# Patient Record
Sex: Female | Born: 1980
Health system: Southern US, Community
[De-identification: ages and names within clinical notes are randomized; demographics above are authoritative.]

## PROBLEM LIST (undated history)

## (undated) DIAGNOSIS — F32A Depression, unspecified: Secondary | ICD-10-CM

## (undated) DIAGNOSIS — F329 Major depressive disorder, single episode, unspecified: Secondary | ICD-10-CM

## (undated) DIAGNOSIS — N83209 Unspecified ovarian cyst, unspecified side: Secondary | ICD-10-CM

## (undated) DIAGNOSIS — N915 Oligomenorrhea, unspecified: Secondary | ICD-10-CM

## (undated) DIAGNOSIS — D649 Anemia, unspecified: Secondary | ICD-10-CM

## (undated) DIAGNOSIS — E282 Polycystic ovarian syndrome: Secondary | ICD-10-CM

## (undated) DIAGNOSIS — Z9882 Breast implant status: Secondary | ICD-10-CM

## (undated) DIAGNOSIS — D62 Acute posthemorrhagic anemia: Secondary | ICD-10-CM

## (undated) DIAGNOSIS — F419 Anxiety disorder, unspecified: Secondary | ICD-10-CM

## (undated) HISTORY — PX: WISDOM TOOTH EXTRACTION: SHX21

---
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
## 2001-12-31 ENCOUNTER — Other Ambulatory Visit: Admission: RE | Admit: 2001-12-31 | Discharge: 2001-12-31 | Payer: Self-pay | Admitting: Obstetrics and Gynecology

## 2002-12-22 ENCOUNTER — Other Ambulatory Visit: Admission: RE | Admit: 2002-12-22 | Discharge: 2002-12-22 | Payer: Self-pay | Admitting: Obstetrics and Gynecology

## 2003-12-25 ENCOUNTER — Other Ambulatory Visit: Admission: RE | Admit: 2003-12-25 | Discharge: 2003-12-25 | Payer: Self-pay | Admitting: Obstetrics and Gynecology

## 2004-02-07 ENCOUNTER — Emergency Department (HOSPITAL_COMMUNITY): Admission: EM | Admit: 2004-02-07 | Discharge: 2004-02-07 | Payer: Self-pay | Admitting: Emergency Medicine

## 2004-12-31 ENCOUNTER — Ambulatory Visit (HOSPITAL_COMMUNITY): Admission: RE | Admit: 2004-12-31 | Discharge: 2004-12-31 | Payer: Self-pay | Admitting: Internal Medicine

## 2006-10-14 ENCOUNTER — Emergency Department (HOSPITAL_COMMUNITY): Admission: EM | Admit: 2006-10-14 | Discharge: 2006-10-15 | Payer: Self-pay | Admitting: Emergency Medicine

## 2008-05-27 ENCOUNTER — Ambulatory Visit (HOSPITAL_COMMUNITY): Admission: RE | Admit: 2008-05-27 | Discharge: 2008-05-27 | Payer: Self-pay | Admitting: Gynecology

## 2008-05-31 ENCOUNTER — Ambulatory Visit (HOSPITAL_COMMUNITY): Admission: RE | Admit: 2008-05-31 | Discharge: 2008-05-31 | Payer: Self-pay | Admitting: Gynecology

## 2009-01-31 ENCOUNTER — Inpatient Hospital Stay (HOSPITAL_COMMUNITY): Admission: AD | Admit: 2009-01-31 | Discharge: 2009-02-01 | Payer: Self-pay | Admitting: Obstetrics & Gynecology

## 2009-02-01 ENCOUNTER — Encounter: Payer: Self-pay | Admitting: Obstetrics and Gynecology

## 2009-09-05 ENCOUNTER — Inpatient Hospital Stay (HOSPITAL_COMMUNITY): Admission: AD | Admit: 2009-09-05 | Discharge: 2009-09-07 | Payer: Self-pay | Admitting: Obstetrics and Gynecology

## 2009-12-25 ENCOUNTER — Emergency Department (HOSPITAL_BASED_OUTPATIENT_CLINIC_OR_DEPARTMENT_OTHER): Admission: EM | Admit: 2009-12-25 | Discharge: 2009-12-25 | Payer: Self-pay | Admitting: Emergency Medicine

## 2009-12-25 ENCOUNTER — Ambulatory Visit: Payer: Self-pay | Admitting: Diagnostic Radiology

## 2010-11-03 ENCOUNTER — Emergency Department (HOSPITAL_COMMUNITY): Admission: EM | Admit: 2010-11-03 | Discharge: 2010-11-03 | Payer: Self-pay | Admitting: Family Medicine

## 2011-02-25 LAB — POCT RAPID STREP A (OFFICE): Streptococcus, Group A Screen (Direct): NEGATIVE

## 2011-03-21 LAB — CBC
Hemoglobin: 10.5 g/dL — ABNORMAL LOW (ref 12.0–15.0)
MCV: 96.7 fL (ref 78.0–100.0)
Platelets: 166 10*3/uL (ref 150–400)
Platelets: 209 10*3/uL (ref 150–400)
RDW: 12.6 % (ref 11.5–15.5)
WBC: 9 10*3/uL (ref 4.0–10.5)

## 2011-03-21 LAB — RPR: RPR Ser Ql: NONREACTIVE

## 2011-04-01 LAB — COMPREHENSIVE METABOLIC PANEL
ALT: 12 U/L (ref 0–35)
ALT: 17 U/L (ref 0–35)
AST: 21 U/L (ref 0–37)
Albumin: 2.9 g/dL — ABNORMAL LOW (ref 3.5–5.2)
Alkaline Phosphatase: 52 U/L (ref 39–117)
CO2: 24 mEq/L (ref 19–32)
CO2: 26 mEq/L (ref 19–32)
Calcium: 7.7 mg/dL — ABNORMAL LOW (ref 8.4–10.5)
Chloride: 102 mEq/L (ref 96–112)
Chloride: 97 mEq/L (ref 96–112)
Creatinine, Ser: 0.45 mg/dL (ref 0.4–1.2)
GFR calc Af Amer: >60 mL/min (ref >60)
GFR calc non Af Amer: >60 mL/min (ref >60)
GFR calc non Af Amer: >60 mL/min (ref >60)
Glucose, Bld: 104 mg/dL — ABNORMAL HIGH (ref 70–99)
Potassium: 4.2 mEq/L (ref 3.5–5.1)
Sodium: 126 mEq/L — ABNORMAL LOW (ref 135–145)
Sodium: 135 mEq/L (ref 135–145)
Total Bilirubin: 0.5 mg/dL (ref 0.3–1.2)

## 2011-04-01 LAB — URINE MICROSCOPIC-ADD ON

## 2011-04-01 LAB — URINE CULTURE

## 2011-04-01 LAB — URINALYSIS, ROUTINE W REFLEX MICROSCOPIC
Bilirubin Urine: NEGATIVE
Specific Gravity, Urine: >1.03 — ABNORMAL HIGH (ref 1.005–1.030)
pH: 6 (ref 5.0–8.0)

## 2011-04-01 LAB — CBC
Hemoglobin: 11.8 g/dL — ABNORMAL LOW (ref 12.0–15.0)
MCHC: 33.7 g/dL (ref 30.0–36.0)
MCHC: 34.4 g/dL (ref 30.0–36.0)
Platelets: 249 10*3/uL (ref 150–400)
RBC: 2.86 MIL/uL — ABNORMAL LOW (ref 3.87–5.11)
RBC: 3.56 MIL/uL — ABNORMAL LOW (ref 3.87–5.11)
WBC: 10.9 10*3/uL — ABNORMAL HIGH (ref 4.0–10.5)
WBC: 12.4 10*3/uL — ABNORMAL HIGH (ref 4.0–10.5)

## 2011-04-01 LAB — BASIC METABOLIC PANEL
BUN: 3 mg/dL — ABNORMAL LOW (ref 6–23)
Chloride: 105 mEq/L (ref 96–112)
Creatinine, Ser: 0.53 mg/dL (ref 0.4–1.2)
Glucose, Bld: 69 mg/dL — ABNORMAL LOW (ref 70–99)
Potassium: 3.3 mEq/L — ABNORMAL LOW (ref 3.5–5.1)

## 2011-04-22 IMAGING — CR DG WRIST COMPLETE 3+V*R*
4 series · 4 of 4 positions shown · non-contrast
Comparison: None available.

CLINICAL DATA: Fall.  Right wrist pain.

RIGHT WRIST - COMPLETE 3+ VIEW

[x wrist lat right]
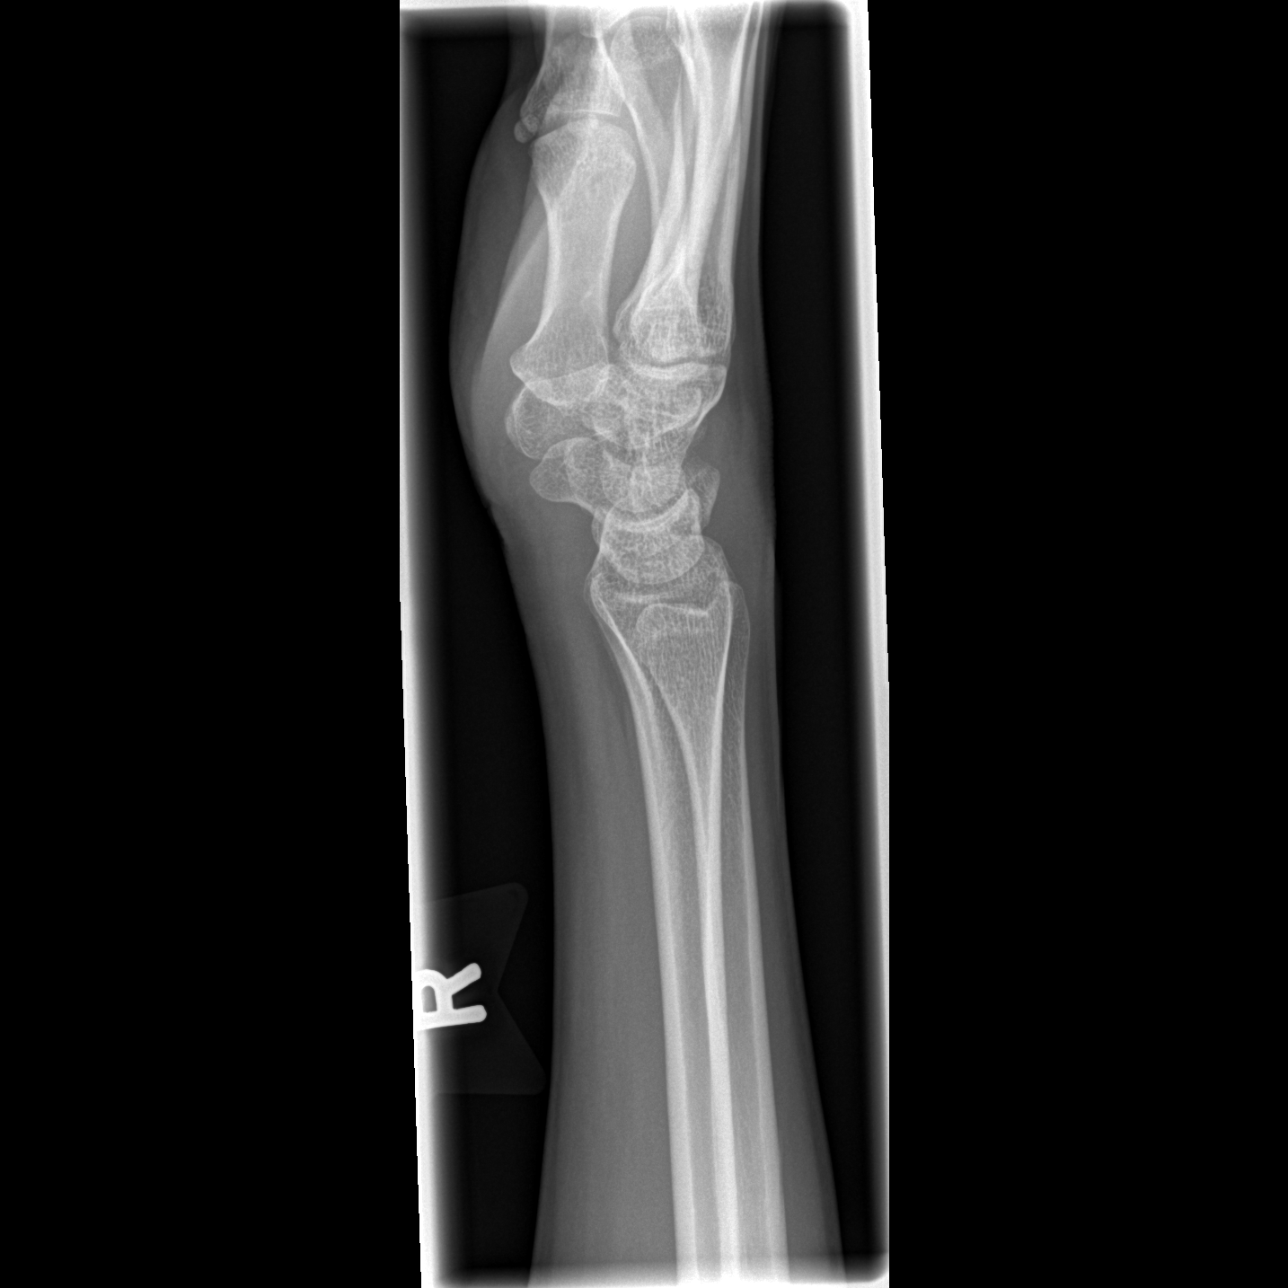

[x wrist pa right]
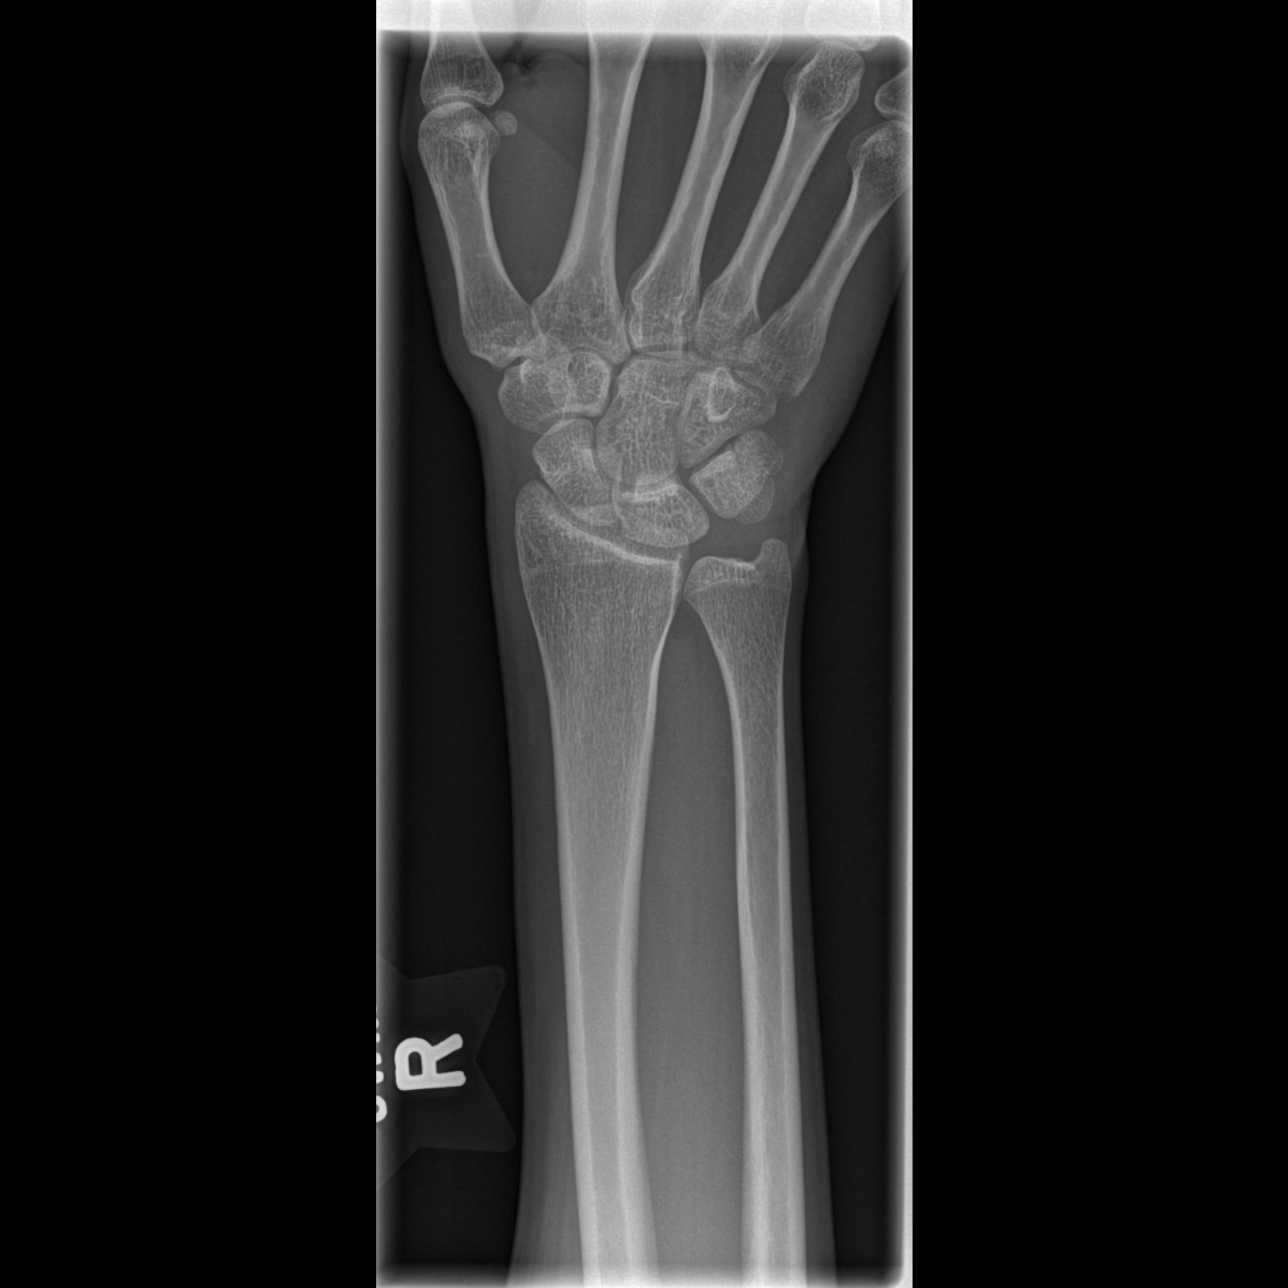

[x wrist obl right]
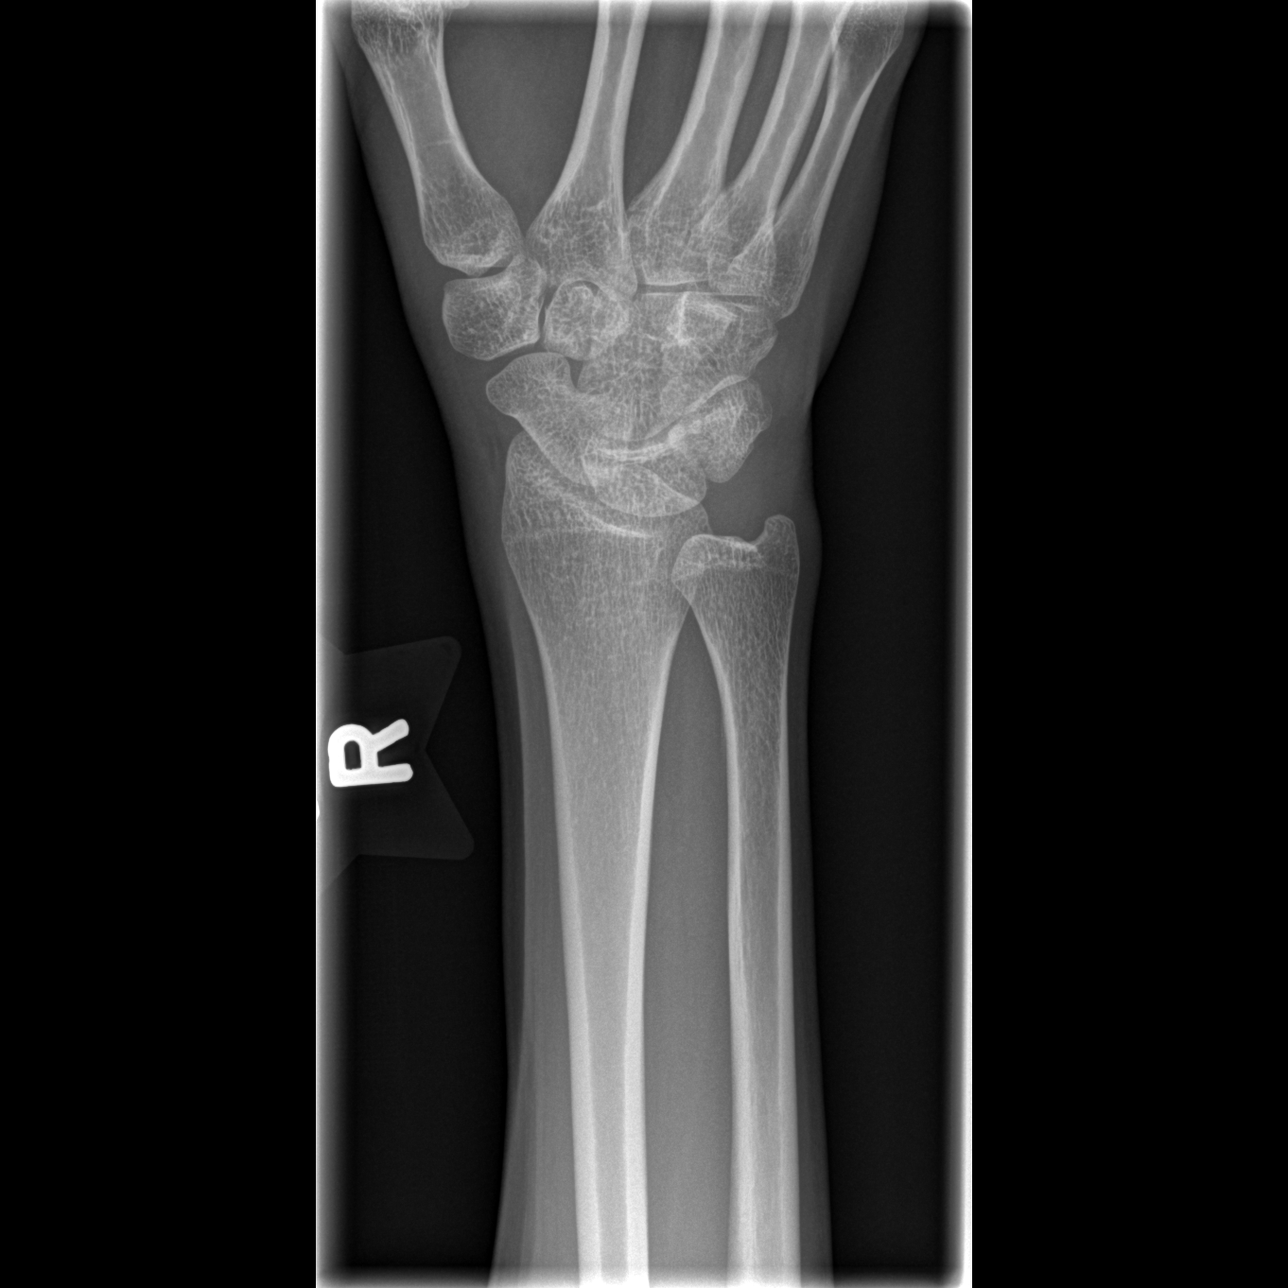

[x navicular]
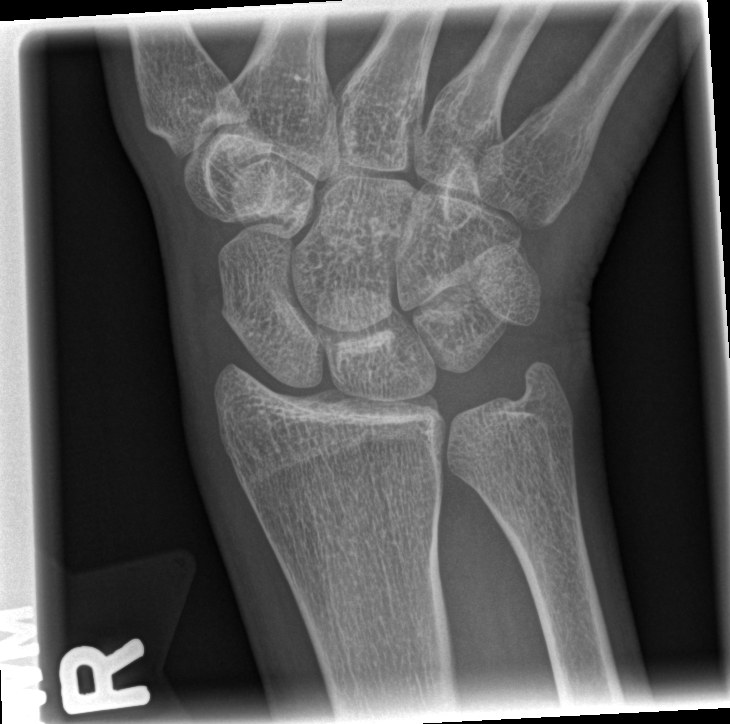

[4 of 4 positions shown; findings below may reference images not displayed]

FINDINGS: There is linear lucency extending sagittally through the
distal triquetrum, suspicious for nondisplaced fracture.
Scaphoid is intact.  Dorsal soft tissue swelling is present over
the wrist.  Fracture plane not appreciated on the lateral view.
IMPRESSION: Nondisplaced triquetral fracture.

## 2011-06-13 ENCOUNTER — Inpatient Hospital Stay (INDEPENDENT_AMBULATORY_CARE_PROVIDER_SITE_OTHER)
Admission: RE | Admit: 2011-06-13 | Discharge: 2011-06-13 | Disposition: A | Discharge status: Home / Self Care | Payer: 59 | Source: Ambulatory Visit | Attending: Emergency Medicine | Admitting: Emergency Medicine

## 2011-06-13 DIAGNOSIS — W57XXXA Bitten or stung by nonvenomous insect and other nonvenomous arthropods, initial encounter: Secondary | ICD-10-CM

## 2011-06-13 DIAGNOSIS — L0291 Cutaneous abscess, unspecified: Secondary | ICD-10-CM

## 2011-12-16 NOTE — L&D Delivery Note (Signed)
Delivery Note At 8:55 PM a viable and healthy female was delivered via Vaginal, Spontaneous Delivery (Presentation: ; Occiput Anterior).  APGAR:9 ,9 ; weight .  8lb 3 oz Placenta status: Intact, Spontaneous. Marginal cord insertion  Cord: 3 vessels with the following complications: None.  Cord pH: none  Anesthesia: Local  Episiotomy: None Lacerations: 2nd degree;Perineal Suture Repair: 3.0 chromic Est. Blood Loss (mL): 800 Uterine atony noted. Bimanual massage, cervix checked: no laceration noted. Bladder cath. IM pitocin( no IV access at the time). Methergine, cytotec per rectum done. Mom to postpartum.  Baby to nursery-stable.  Cherice Glennie A 09/19/2012, 10:08 PM

## 2012-02-20 LAB — OB RESULTS CONSOLE HEPATITIS B SURFACE ANTIGEN: Hepatitis B Surface Ag: NEGATIVE

## 2012-02-20 LAB — OB RESULTS CONSOLE ABO/RH

## 2012-02-27 LAB — OB RESULTS CONSOLE GC/CHLAMYDIA
Chlamydia: NEGATIVE
Gonorrhea: NEGATIVE

## 2012-08-31 LAB — OB RESULTS CONSOLE GBS: GBS: NEGATIVE

## 2012-09-17 ENCOUNTER — Other Ambulatory Visit: Payer: Self-pay | Admitting: Obstetrics and Gynecology

## 2012-09-19 ENCOUNTER — Encounter (HOSPITAL_COMMUNITY): Payer: Self-pay | Admitting: *Deleted

## 2012-09-19 ENCOUNTER — Inpatient Hospital Stay (HOSPITAL_COMMUNITY)
Admission: AD | Admit: 2012-09-19 | Discharge: 2012-09-21 | DRG: 774 | Disposition: A | Discharge status: Home / Self Care | Payer: 59 | Source: Ambulatory Visit | Attending: Obstetrics and Gynecology | Admitting: Obstetrics and Gynecology

## 2012-09-19 DIAGNOSIS — D62 Acute posthemorrhagic anemia: Secondary | ICD-10-CM | POA: Diagnosis not present

## 2012-09-19 DIAGNOSIS — O9903 Anemia complicating the puerperium: Secondary | ICD-10-CM | POA: Diagnosis not present

## 2012-09-19 DIAGNOSIS — O2210 Genital varices in pregnancy, unspecified trimester: Secondary | ICD-10-CM | POA: Diagnosis present

## 2012-09-19 HISTORY — DX: Major depressive disorder, single episode, unspecified: F32.9

## 2012-09-19 HISTORY — DX: Oligomenorrhea, unspecified: N91.5

## 2012-09-19 HISTORY — DX: Polycystic ovarian syndrome: E28.2

## 2012-09-19 HISTORY — DX: Anxiety disorder, unspecified: F41.9

## 2012-09-19 HISTORY — DX: Acute posthemorrhagic anemia: D62

## 2012-09-19 HISTORY — DX: Depression, unspecified: F32.A

## 2012-09-19 HISTORY — DX: Anemia, unspecified: D64.9

## 2012-09-19 HISTORY — DX: Unspecified ovarian cyst, unspecified side: N83.209

## 2012-09-19 HISTORY — DX: Breast implant status: Z98.82

## 2012-09-19 LAB — ABO/RH: ABO/RH(D): O POS

## 2012-09-19 LAB — CBC
HCT: 34.5 % — ABNORMAL LOW (ref 36.0–46.0)
MCH: 32.2 pg (ref 26.0–34.0)
MCHC: 33.9 g/dL (ref 30.0–36.0)
RDW: 12.9 % (ref 11.5–15.5)

## 2012-09-19 MED ORDER — TERBUTALINE SULFATE 1 MG/ML IJ SOLN: 0.2500 mg | Freq: Once | INTRAMUSCULAR | Status: DC | PRN

## 2012-09-19 MED ORDER — LACTATED RINGERS IV SOLN: 500.0000 mL | Freq: Once | INTRAVENOUS | Status: DC

## 2012-09-19 MED ORDER — ONDANSETRON HCL 4 MG/2ML IJ SOLN: 4.0000 mg | Freq: Four times a day (QID) | INTRAMUSCULAR | Status: DC | PRN

## 2012-09-19 MED ORDER — FENTANYL 2.5 MCG/ML BUPIVACAINE 1/10 % EPIDURAL INFUSION (WH - ANES): 14.0000 mL/h | INTRAMUSCULAR | Status: DC

## 2012-09-19 MED ORDER — IBUPROFEN 600 MG PO TABS
600.0000 mg | ORAL_TABLET | Freq: Four times a day (QID) | ORAL | Status: DC | PRN
  Administered 2012-09-19: 600 mg via ORAL
  Filled 2012-09-19: qty 1

## 2012-09-19 MED ORDER — CITRIC ACID-SODIUM CITRATE 334-500 MG/5ML PO SOLN: 30.0000 mL | ORAL | Status: DC | PRN

## 2012-09-19 MED ORDER — LIDOCAINE HCL (PF) 1 % IJ SOLN
30.0000 mL | INTRAMUSCULAR | Status: DC | PRN
  Administered 2012-09-19: 30 mL via SUBCUTANEOUS
  Filled 2012-09-19: qty 30

## 2012-09-19 MED ORDER — LACTATED RINGERS IV SOLN: INTRAVENOUS | Status: DC

## 2012-09-19 MED ORDER — OXYTOCIN 40 UNITS IN LACTATED RINGERS INFUSION - SIMPLE MED
62.5000 mL/h | Freq: Once | INTRAVENOUS | Status: AC
  Administered 2012-09-19: 62.5 mL/h via INTRAVENOUS
  Filled 2012-09-19: qty 1000

## 2012-09-19 MED ORDER — PHENYLEPHRINE 40 MCG/ML (10ML) SYRINGE FOR IV PUSH (FOR BLOOD PRESSURE SUPPORT): 80.0000 ug | PREFILLED_SYRINGE | INTRAVENOUS | Status: DC | PRN

## 2012-09-19 MED ORDER — MISOPROSTOL 200 MCG PO TABS: 800.0000 ug | ORAL_TABLET | Freq: Once | ORAL | Status: DC

## 2012-09-19 MED ORDER — DIPHENHYDRAMINE HCL 50 MG/ML IJ SOLN: 12.5000 mg | INTRAMUSCULAR | Status: DC | PRN

## 2012-09-19 MED ORDER — OXYTOCIN BOLUS FROM INFUSION
500.0000 mL | Freq: Once | INTRAVENOUS | Status: AC
  Administered 2012-09-19: 500 mL via INTRAVENOUS
  Filled 2012-09-19: qty 500

## 2012-09-19 MED ORDER — EPHEDRINE 5 MG/ML INJ: 10.0000 mg | INTRAVENOUS | Status: DC | PRN

## 2012-09-19 MED ORDER — OXYTOCIN 40 UNITS IN LACTATED RINGERS INFUSION - SIMPLE MED: 1.0000 m[IU]/min | INTRAVENOUS | Status: DC

## 2012-09-19 MED ORDER — OXYCODONE-ACETAMINOPHEN 5-325 MG PO TABS: 1.0000 | ORAL_TABLET | ORAL | Status: DC | PRN

## 2012-09-19 MED ORDER — MISOPROSTOL 200 MCG PO TABS
ORAL_TABLET | ORAL | Status: AC
  Administered 2012-09-19: 22:00:00
  Filled 2012-09-19: qty 4

## 2012-09-19 MED ORDER — ACETAMINOPHEN 325 MG PO TABS: 650.0000 mg | ORAL_TABLET | ORAL | Status: DC | PRN

## 2012-09-19 MED ORDER — LACTATED RINGERS IV SOLN: 500.0000 mL | INTRAVENOUS | Status: DC | PRN

## 2012-09-19 MED ORDER — OXYTOCIN 10 UNIT/ML IJ SOLN
INTRAMUSCULAR | Status: AC
  Administered 2012-09-19: 20 [IU]
  Filled 2012-09-19: qty 2

## 2012-09-19 MED ORDER — METHYLERGONOVINE MALEATE 0.2 MG/ML IJ SOLN
INTRAMUSCULAR | Status: AC
  Administered 2012-09-19: 0.2 mg
  Filled 2012-09-19: qty 1

## 2012-09-19 NOTE — H&P (Signed)
Nicole [name] is [name] 31 y.o. female presenting for admision due to SROM clear fluid @ 6 18 pm. GBS cx neg.  Maternal Medical History:  Reason for admission: Reason for admission: rupture of membranes.  Contractions: Onset was 3-5 hours ago.   Perceived severity is moderate.    Fetal activity: Perceived fetal activity is normal.    Prenatal complications: no prenatal complications   OB History    Grav Para Term Preterm Abortions TAB SAB Ect Mult Living   1              Past Medical History  Diagnosis Date  . Anemia   . Depression     Post partum  . Anxiety   . PCOS (polycystic ovarian syndrome)   . H/O breast augmentation   . Oligomenorrhea   . Newborn product of IVF pregnancy   . Ruptured ovarian cyst    Past Surgical History  Procedure Date  . Wisdom tooth extraction    Family History: family history is not on file. Social History:  does not have [name] smoking history on file. She does not have any smokeless tobacco history on file. She reports that she does not drink alcohol or use illicit drugs.   Prenatal Transfer Tool  Maternal Diabetes: No Genetic Screening: Normal Maternal Ultrasounds/Referrals: Normal Fetal Ultrasounds or other Referrals:  None Maternal Substance Abuse:  No Significant Maternal Medications:  None Significant Maternal Lab Results:  Lab values include: Group B Strep negative Other Comments:  None  Review of Systems  All other systems reviewed and are negative.    Dilation: 3.5 Effacement (%): 80;90 Station: -2;-1 Exam by:: L. Cresenzo, RN Blood pressure 127/80, pulse 80, temperature 98.5 F (36.9 C), temperature source Oral, resp. rate 20, height 5\' 10"  (1.778 m), weight 72.576 kg (160 lb). Maternal Exam:  Uterine Assessment: Contraction strength is moderate.  Contraction frequency is regular.   Abdomen: Patient reports no abdominal tenderness. Fetal presentation: vertex  Introitus: Vulva is positive for vulvar varicosities.   Physical  Exam  Constitutional: She is oriented to person, place, and time. She appears well-developed and well-nourished.  HENT:  Head: Atraumatic.  Eyes: EOM are normal.  Cardiovascular: Regular rhythm.   Respiratory: Effort normal.  GI: Soft. Bowel sounds are normal.  Musculoskeletal: She exhibits no edema.  Neurological: She is alert and oriented to person, place, and time.  Skin: Skin is warm and dry.  Psychiatric: She has [name] normal mood and affect.   VE( RN) 3-4 cm on admit then progressed to 6 cm and fully dilated thereafter Prenatal labs: ABO, Rh: --/--/O POS (10/06 2010) Antibody: Negative (03/08 0000) Rubella: Immune (03/08 0000) RPR: Nonreactive (07/25 0000)  HBsAg: Negative (03/08 0000)  HIV: Non-reactive (03/08 0000)  GBS: Negative (09/17 0000)   Assessment/Plan: SROM Term gestation Complete P) admit routine labs   Nicole [name] 09/19/2012, 9:58 PM

## 2012-09-20 LAB — CBC
Platelets: 163 10*3/uL (ref 150–400)
RBC: 2.84 MIL/uL — ABNORMAL LOW (ref 3.87–5.11)
RDW: 12.8 % (ref 11.5–15.5)
WBC: 16.6 10*3/uL — ABNORMAL HIGH (ref 4.0–10.5)

## 2012-09-20 MED ORDER — BENZOCAINE-MENTHOL 20-0.5 % EX AERO
1.0000 "application " | INHALATION_SPRAY | CUTANEOUS | Status: DC | PRN
  Administered 2012-09-20: 1 via TOPICAL
  Filled 2012-09-20: qty 56

## 2012-09-20 MED ORDER — ONDANSETRON HCL 4 MG/2ML IJ SOLN: 4.0000 mg | INTRAMUSCULAR | Status: DC | PRN

## 2012-09-20 MED ORDER — SIMETHICONE 80 MG PO CHEW: 80.0000 mg | CHEWABLE_TABLET | ORAL | Status: DC | PRN

## 2012-09-20 MED ORDER — PRENATAL MULTIVITAMIN CH
1.0000 | ORAL_TABLET | Freq: Every day | ORAL | Status: DC
  Administered 2012-09-20 – 2012-09-21 (×2): 1 via ORAL
  Filled 2012-09-20 (×2): qty 1

## 2012-09-20 MED ORDER — ONDANSETRON HCL 4 MG PO TABS: 4.0000 mg | ORAL_TABLET | ORAL | Status: DC | PRN

## 2012-09-20 MED ORDER — TETANUS-DIPHTH-ACELL PERTUSSIS 5-2.5-18.5 LF-MCG/0.5 IM SUSP
0.5000 mL | Freq: Once | INTRAMUSCULAR | Status: AC
  Administered 2012-09-20: 0.5 mL via INTRAMUSCULAR
  Filled 2012-09-20: qty 0.5

## 2012-09-20 MED ORDER — SENNOSIDES-DOCUSATE SODIUM 8.6-50 MG PO TABS
2.0000 | ORAL_TABLET | Freq: Every day | ORAL | Status: DC
  Administered 2012-09-20: 2 via ORAL

## 2012-09-20 MED ORDER — WITCH HAZEL-GLYCERIN EX PADS: 1.0000 "application " | MEDICATED_PAD | CUTANEOUS | Status: DC | PRN

## 2012-09-20 MED ORDER — FERROUS SULFATE 325 (65 FE) MG PO TABS
325.0000 mg | ORAL_TABLET | Freq: Two times a day (BID) | ORAL | Status: DC
  Administered 2012-09-20 – 2012-09-21 (×3): 325 mg via ORAL
  Filled 2012-09-20 (×3): qty 1

## 2012-09-20 MED ORDER — METHYLERGONOVINE MALEATE 0.2 MG/ML IJ SOLN: 0.2000 mg | Freq: Four times a day (QID) | INTRAMUSCULAR | Status: DC

## 2012-09-20 MED ORDER — METHYLERGONOVINE MALEATE 0.2 MG PO TABS
0.2000 mg | ORAL_TABLET | Freq: Four times a day (QID) | ORAL | Status: DC
  Administered 2012-09-20 (×2): 0.2 mg via ORAL
  Filled 2012-09-20 (×2): qty 1

## 2012-09-20 MED ORDER — ZOLPIDEM TARTRATE 5 MG PO TABS: 5.0000 mg | ORAL_TABLET | Freq: Every evening | ORAL | Status: DC | PRN

## 2012-09-20 MED ORDER — DOCUSATE SODIUM 100 MG PO CAPS
100.0000 mg | ORAL_CAPSULE | Freq: Every day | ORAL | Status: DC
  Administered 2012-09-20 – 2012-09-21 (×2): 100 mg via ORAL
  Filled 2012-09-20 (×2): qty 1

## 2012-09-20 MED ORDER — LANOLIN HYDROUS EX OINT: TOPICAL_OINTMENT | CUTANEOUS | Status: DC | PRN

## 2012-09-20 MED ORDER — DIPHENHYDRAMINE HCL 25 MG PO CAPS: 25.0000 mg | ORAL_CAPSULE | Freq: Four times a day (QID) | ORAL | Status: DC | PRN

## 2012-09-20 MED ORDER — OXYCODONE-ACETAMINOPHEN 5-325 MG PO TABS: 1.0000 | ORAL_TABLET | ORAL | Status: DC | PRN

## 2012-09-20 MED ORDER — IBUPROFEN 600 MG PO TABS
600.0000 mg | ORAL_TABLET | Freq: Four times a day (QID) | ORAL | Status: DC
  Administered 2012-09-20 – 2012-09-21 (×5): 600 mg via ORAL
  Filled 2012-09-20 (×5): qty 1

## 2012-09-20 MED ORDER — DIBUCAINE 1 % RE OINT: 1.0000 "application " | TOPICAL_OINTMENT | RECTAL | Status: DC | PRN

## 2012-09-20 NOTE — Progress Notes (Addendum)
PPD 1 SVD  S:  Reports feeling well- just tired              Tolerating po/ No nausea or vomiting             Bleeding is light             Pain controlled with motrin             Up ad lib / ambulatory  Newborn breast-feeding  / Circumcision planned for tomorrow   O:  A & O x 3              VS: Blood pressure 100/63, pulse 62, temperature 97.6 F (36.4 C), temperature source Oral, resp. rate 18, height 5\' 10"  (1.778 m), weight 72.576 kg (160 lb), SpO2 100.00%, unknown if currently breastfeeding.  LABS: WBC/Hgb/Hct/Plts:  16.6/9.1/26.6/163 (10/07 0528)   Lungs: Clear and unlabored respirations  Heart: regular rate   Abdomen: soft, non-tender, non-distended              Fundus: firm, non-tender, U-1  Perineum: no edema  Lochia: light  Extremities: no edema, no calf pain or tenderness    A: PPD # 1              Mild ABL anemia - stable status  Doing well - stable status  P:  Routine post partum orders             Iron and colace  Anticipate discharge home tomorrow  Marlinda Mike CNM, MSN 09/20/2012, 9:46 AM

## 2012-09-21 ENCOUNTER — Encounter (HOSPITAL_COMMUNITY): Payer: Self-pay

## 2012-09-21 DIAGNOSIS — D62 Acute posthemorrhagic anemia: Secondary | ICD-10-CM

## 2012-09-21 HISTORY — DX: Acute posthemorrhagic anemia: D62

## 2012-09-21 MED ORDER — ESCITALOPRAM OXALATE 10 MG PO TABS: 10.0000 mg | ORAL_TABLET | Freq: Every day | ORAL | Status: DC

## 2012-09-21 MED ORDER — IBUPROFEN 600 MG PO TABS: 600.0000 mg | ORAL_TABLET | Freq: Four times a day (QID) | ORAL | Status: DC

## 2012-09-21 NOTE — Progress Notes (Signed)
Patient was referred for history of depression/anxiety. * Referral screened out by Clinical Social Worker because none of the following criteria appear to apply: ~ History of anxiety/depression during this pregnancy, or of post-partum depression. ~ Diagnosis of anxiety and/or depression within last 3 years ~ History of depression due to pregnancy loss/loss of child OR * Patient's symptoms currently being treated with medication and/or therapy. Please contact the Clinical Social Worker if needs arise, or if patient requests.  Patient is currently taking Lexapro. 

## 2012-09-21 NOTE — Progress Notes (Signed)
UR chart review completed.  

## 2012-09-21 NOTE — Progress Notes (Signed)
Post Partum Day #2            Information for the patient's newborn:  Deaysia, Grigoryan [[id number]]  female   / circumcision done Feeding: breast  Subjective: No HA, SOB, CP, F/C, + nipple soreness. Pain controlled w/ Motrin. Normal vaginal bleeding, no clots.    Hx depression and PPD with last pregnancy, was treated w/ Lexapro. Discussed increased risk of recurrence, desires to resume meds prophylactically.   Objective:  Temp:  [97.8 F (36.6 C)-98 F (36.7 C)] 97.9 F (36.6 C) (10/08 0630) Pulse Rate:  [73-93] 73  (10/08 0630) Resp:  [18-20] 18  (10/08 0630) BP: (103-111)/(66-72) 103/66 mmHg (10/08 0630)  No intake or output data in the 24 hours ending 09/21/12 0932     Basename 09/20/12 0528 09/19/12 2010  WBC 16.6* 10.9*  HGB 9.1* 11.7*  HCT 26.6* 34.5*  PLT 163 189    Blood type: --/--/O POS, O POS (10/06 2010) Rubella: Immune (03/08 0000)    Physical Exam:  General: alert, cooperative and no distress Uterine Fundus: firm Lochia: appropriate Perineum: repair intact, edema mild DVT Evaluation: Negative Homan's sign. No significant calf/ankle edema.    Assessment/Plan: PPD # 2 / 31 y.o., [id number] S/P:spontaneous vaginal   Active Problems:  Postpartum care following vaginal delivery w/ 2nd degree (10/6)  Acute blood loss anemia  Mild, asymptomatic, will start oral Fe supplement to continue 4-6 wks PP Hx depression and PPD  Start Lexapro 10 mg daily TDaP given inpatient   normal postpartum exam  Continue current postpartum care  D/C home   LOS: 2 days   PAUL,DANIELA, CNM, MSN 09/21/2012, 9:32 AM

## 2012-09-21 NOTE — Discharge Summary (Signed)
Obstetric Discharge Summary Reason for Admission: onset of labor and rupture of membranes Prenatal Procedures: ultrasound Intrapartum Procedures: spontaneous vaginal delivery Postpartum Procedures: TDaP vaccine Complications-Operative and Postpartum: 2nd degree perineal laceration and hemorrhage / uterine atony Hemoglobin  Date Value Range Status  09/20/2012 9.1* 12.0 - 15.0 g/dL Final     DELTA CHECK NOTED     REPEATED TO VERIFY     HCT  Date Value Range Status  09/20/2012 26.6* 36.0 - 46.0 % Final    Physical Exam:  General: alert, cooperative and no distress Lochia: appropriate Uterine Fundus: firm Incision: healing well DVT Evaluation: Negative Homan's sign. No significant calf/ankle edema.  Discharge Diagnoses: Term Pregnancy-delivered and mild acute blood loss anemia Hx post partum depression at increased risk of recurrence  Discharge Information: Date: 09/21/2012 Activity: pelvic rest Diet: routine Medications: PNV, Ibuprofen, Colace, Iron and Lexapro Condition: stable Instructions: refer to practice specific booklet Discharge to: home Follow-up Information    Follow up with Joselynn Amoroso A, MD. Schedule an appointment as soon as possible for a visit in 6 weeks.   Contact information:   [address] [place] [address] [phone]          Newborn Data: Live born female  Birth Weight: 8 lb 3 oz (3714 g) APGAR: 9, 9  Home with mother.  PAUL,DANIELA 09/21/2012, 9:49 AM

## 2012-09-22 LAB — TYPE AND SCREEN
ABO/RH(D): O POS
Antibody Screen: NEGATIVE
Unit division: 0
Unit division: 0

## 2012-09-27 ENCOUNTER — Inpatient Hospital Stay (HOSPITAL_COMMUNITY): Admission: RE | Admit: 2012-09-27 | Payer: 59 | Source: Ambulatory Visit

## 2014-10-16 ENCOUNTER — Encounter (HOSPITAL_COMMUNITY): Payer: Self-pay

## 2014-10-19 LAB — OB RESULTS CONSOLE RUBELLA ANTIBODY, IGM: Rubella: IMMUNE

## 2014-10-19 LAB — OB RESULTS CONSOLE HEPATITIS B SURFACE ANTIGEN: HEP B S AG: NEGATIVE

## 2014-12-15 NOTE — L&D Delivery Note (Signed)
Delivery Note At 11:14 PM a viable and healthy female was delivered via Vaginal, Spontaneous Delivery (Presentation: Right Occiput Posterior).  APGAR: 8, 9; weight  pending .   Placenta status: Abnormal, Spontaneous Pathology.  Cord: 3 vessels with the following complications: None.  Cord pH: none  Anesthesia: Epidural  Episiotomy: None Lacerations: 1st degree;Perineal Suture Repair: 3.0 chromic Est. Blood Loss (mL):  400  Mom to postpartum.  Baby to Couplet care / Skin to Skin.  Nicole [name] A 05/11/2015, 11:47 PM

## 2015-05-11 ENCOUNTER — Encounter (HOSPITAL_COMMUNITY): Payer: Self-pay | Admitting: *Deleted

## 2015-05-11 ENCOUNTER — Inpatient Hospital Stay (HOSPITAL_COMMUNITY)
Admission: AD | Admit: 2015-05-11 | Discharge: 2015-05-13 | DRG: 775 | Disposition: A | Discharge status: Home / Self Care | Payer: 59 | Source: Ambulatory Visit | Attending: Obstetrics and Gynecology | Admitting: Obstetrics and Gynecology

## 2015-05-11 ENCOUNTER — Inpatient Hospital Stay (HOSPITAL_COMMUNITY): Payer: 59 | Admitting: Anesthesiology

## 2015-05-11 DIAGNOSIS — Z3A39 39 weeks gestation of pregnancy: Secondary | ICD-10-CM | POA: Diagnosis present

## 2015-05-11 DIAGNOSIS — D62 Acute posthemorrhagic anemia: Secondary | ICD-10-CM | POA: Diagnosis not present

## 2015-05-11 DIAGNOSIS — O09523 Supervision of elderly multigravida, third trimester: Secondary | ICD-10-CM

## 2015-05-11 DIAGNOSIS — Z3483 Encounter for supervision of other normal pregnancy, third trimester: Secondary | ICD-10-CM | POA: Diagnosis present

## 2015-05-11 DIAGNOSIS — IMO0001 Reserved for inherently not codable concepts without codable children: Secondary | ICD-10-CM

## 2015-05-11 LAB — CBC
HEMATOCRIT: 36.3 % (ref 36.0–46.0)
Hemoglobin: 12.4 g/dL (ref 12.0–15.0)
MCH: 32.3 pg (ref 26.0–34.0)
MCHC: 34.2 g/dL (ref 30.0–36.0)
MCV: 94.5 fL (ref 78.0–100.0)
Platelets: 177 10*3/uL (ref 150–400)
RBC: 3.84 MIL/uL — AB (ref 3.87–5.11)
RDW: 12.9 % (ref 11.5–15.5)
WBC: 9.6 10*3/uL (ref 4.0–10.5)

## 2015-05-11 LAB — OB RESULTS CONSOLE HIV ANTIBODY (ROUTINE TESTING): HIV: NONREACTIVE

## 2015-05-11 LAB — TYPE AND SCREEN
ABO/RH(D): O POS
Antibody Screen: NEGATIVE

## 2015-05-11 LAB — OB RESULTS CONSOLE GBS: GBS: NEGATIVE

## 2015-05-11 LAB — OB RESULTS CONSOLE GC/CHLAMYDIA
CHLAMYDIA, DNA PROBE: NEGATIVE
GC PROBE AMP, GENITAL: NEGATIVE

## 2015-05-11 LAB — OB RESULTS CONSOLE RPR: RPR: NONREACTIVE

## 2015-05-11 MED ORDER — OXYCODONE-ACETAMINOPHEN 5-325 MG PO TABS: 2.0000 | ORAL_TABLET | ORAL | Status: DC | PRN

## 2015-05-11 MED ORDER — OXYTOCIN 40 UNITS IN LACTATED RINGERS INFUSION - SIMPLE MED
62.5000 mL/h | INTRAVENOUS | Status: DC
Start: 2015-05-11 — End: 2015-05-12

## 2015-05-11 MED ORDER — OXYTOCIN 40 UNITS IN LACTATED RINGERS INFUSION - SIMPLE MED
INTRAVENOUS | Status: AC
  Administered 2015-05-11: 2 m[IU]/min via INTRAVENOUS
  Filled 2015-05-11: qty 1000

## 2015-05-11 MED ORDER — LACTATED RINGERS IV SOLN: 500.0000 mL | INTRAVENOUS | Status: DC | PRN

## 2015-05-11 MED ORDER — SODIUM CHLORIDE 0.9 % IV SOLN
2.0000 g | Freq: Once | INTRAVENOUS | Status: DC
  Filled 2015-05-11: qty 2000

## 2015-05-11 MED ORDER — LIDOCAINE-EPINEPHRINE (PF) 2 %-1:200000 IJ SOLN
INTRAMUSCULAR | Status: DC | PRN
  Administered 2015-05-11: 4 mL

## 2015-05-11 MED ORDER — PHENYLEPHRINE 40 MCG/ML (10ML) SYRINGE FOR IV PUSH (FOR BLOOD PRESSURE SUPPORT)
PREFILLED_SYRINGE | INTRAVENOUS | Status: AC
  Filled 2015-05-11: qty 20

## 2015-05-11 MED ORDER — CITRIC ACID-SODIUM CITRATE 334-500 MG/5ML PO SOLN: 30.0000 mL | ORAL | Status: DC | PRN

## 2015-05-11 MED ORDER — LIDOCAINE HCL (PF) 1 % IJ SOLN
30.0000 mL | INTRAMUSCULAR | Status: DC | PRN
  Filled 2015-05-11: qty 30

## 2015-05-11 MED ORDER — ONDANSETRON HCL 4 MG/2ML IJ SOLN: 4.0000 mg | Freq: Four times a day (QID) | INTRAMUSCULAR | Status: DC | PRN

## 2015-05-11 MED ORDER — CLINDAMYCIN PHOSPHATE 900 MG/50ML IV SOLN
900.0000 mg | Freq: Three times a day (TID) | INTRAVENOUS | Status: DC
  Filled 2015-05-11: qty 50

## 2015-05-11 MED ORDER — OXYTOCIN 40 UNITS IN LACTATED RINGERS INFUSION - SIMPLE MED
1.0000 m[IU]/min | INTRAVENOUS | Status: DC
  Administered 2015-05-11: 2 m[IU]/min via INTRAVENOUS

## 2015-05-11 MED ORDER — LIDOCAINE HCL (PF) 1 % IJ SOLN
INTRAMUSCULAR | Status: AC
  Filled 2015-05-11: qty 30

## 2015-05-11 MED ORDER — EPHEDRINE 5 MG/ML INJ
10.0000 mg | INTRAVENOUS | Status: DC | PRN
  Filled 2015-05-11: qty 2

## 2015-05-11 MED ORDER — TERBUTALINE SULFATE 1 MG/ML IJ SOLN: 0.2500 mg | Freq: Once | INTRAMUSCULAR | Status: AC | PRN

## 2015-05-11 MED ORDER — OXYCODONE-ACETAMINOPHEN 5-325 MG PO TABS: 1.0000 | ORAL_TABLET | ORAL | Status: DC | PRN

## 2015-05-11 MED ORDER — PHENYLEPHRINE 40 MCG/ML (10ML) SYRINGE FOR IV PUSH (FOR BLOOD PRESSURE SUPPORT)
80.0000 ug | PREFILLED_SYRINGE | INTRAVENOUS | Status: DC | PRN
  Filled 2015-05-11: qty 2

## 2015-05-11 MED ORDER — OXYTOCIN BOLUS FROM INFUSION: 500.0000 mL | INTRAVENOUS | Status: DC

## 2015-05-11 MED ORDER — DIPHENHYDRAMINE HCL 50 MG/ML IJ SOLN: 12.5000 mg | INTRAMUSCULAR | Status: DC | PRN

## 2015-05-11 MED ORDER — LACTATED RINGERS IV SOLN
INTRAVENOUS | Status: DC
  Administered 2015-05-11 (×2): via INTRAVENOUS

## 2015-05-11 MED ORDER — FENTANYL 2.5 MCG/ML BUPIVACAINE 1/10 % EPIDURAL INFUSION (WH - ANES)
INTRAMUSCULAR | Status: AC
  Administered 2015-05-11: 14 mL/h via EPIDURAL
  Filled 2015-05-11: qty 125

## 2015-05-11 MED ORDER — ACETAMINOPHEN 325 MG PO TABS: 650.0000 mg | ORAL_TABLET | ORAL | Status: DC | PRN

## 2015-05-11 MED ORDER — BUPIVACAINE HCL (PF) 0.25 % IJ SOLN
INTRAMUSCULAR | Status: DC | PRN
  Administered 2015-05-11 (×2): 4 mL

## 2015-05-11 MED ORDER — OXYTOCIN 10 UNIT/ML IJ SOLN: 10.0000 [IU] | Freq: Once | INTRAMUSCULAR | Status: DC

## 2015-05-11 MED ORDER — FENTANYL 2.5 MCG/ML BUPIVACAINE 1/10 % EPIDURAL INFUSION (WH - ANES)
14.0000 mL/h | INTRAMUSCULAR | Status: DC | PRN
  Administered 2015-05-11: 12 mL/h via EPIDURAL
  Administered 2015-05-11: 14 mL/h via EPIDURAL

## 2015-05-11 NOTE — Anesthesia Procedure Notes (Signed)
Epidural Patient location during procedure: OB  Staffing Anesthesiologist: Shirly Bartosiewicz, CHRIS Performed by: anesthesiologist   Preanesthetic Checklist Completed: patient identified, surgical consent, pre-op evaluation, timeout performed, IV checked, risks and benefits discussed and monitors and equipment checked  Epidural Patient position: sitting Prep: site prepped and draped and DuraPrep Patient monitoring: heart rate, cardiac monitor, continuous pulse ox and blood pressure Approach: midline Location: L4-L5 Injection technique: LOR saline  Needle:  Needle type: Tuohy  Needle gauge: 17 G Needle length: 9 cm Needle insertion depth: 5 cm Catheter type: closed end flexible Catheter size: 19 Gauge Catheter at skin depth: 12 cm Test dose: negative and 2% lidocaine with Epi 1:200 K  Assessment Events: blood not aspirated, injection not painful, no injection resistance, negative IV test and no paresthesia  Additional Notes H+P and labs checked, risks and benefits discussed with the patient, consent obtained, procedure tolerated well and without complications.  Reason for block:procedure for pain

## 2015-05-11 NOTE — Anesthesia Preprocedure Evaluation (Signed)
Anesthesia Evaluation  Patient identified by MRN, date of birth, ID band Patient awake    Reviewed: Allergy & Precautions, NPO status , Patient's Chart, lab work & pertinent test results  History of Anesthesia Complications Negative for: history of anesthetic complications  Airway Mallampati: II  TM Distance: >3 FB Neck ROM: Full    Dental  (+) Teeth Intact   Pulmonary neg pulmonary ROS,  breath sounds clear to auscultation        Cardiovascular negative cardio ROS  Rhythm:Regular     Neuro/Psych PSYCHIATRIC DISORDERS Anxiety Depression negative neurological ROS     GI/Hepatic negative GI ROS, Neg liver ROS,   Endo/Other  negative endocrine ROS  Renal/GU negative Renal ROS     Musculoskeletal   Abdominal   Peds  Hematology negative hematology ROS (+)   Anesthesia Other Findings   Reproductive/Obstetrics (+) Pregnancy                             Anesthesia Physical Anesthesia Plan  ASA: II  Anesthesia Plan: Epidural   Post-op Pain Management:    Induction:   Airway Management Planned:   Additional Equipment:   Intra-op Plan:   Post-operative Plan:   Informed Consent: I have reviewed the patients History and Physical, chart, labs and discussed the procedure including the risks, benefits and alternatives for the proposed anesthesia with the patient or authorized representative who has indicated his/her understanding and acceptance.     Plan Discussed with: Anesthesiologist  Anesthesia Plan Comments:         Anesthesia Quick Evaluation

## 2015-05-11 NOTE — Progress Notes (Signed)
S: comfortable  O: Epidural Pitocin SROM clear fluid VE: 9.5/100/0 station  Tracing: baseline 140 (+) early decel Ctx ? Frequency'  IMP: active phase P) cont present mgmt

## 2015-05-11 NOTE — H&P (Signed)
Nicole Nicole [name] is Nicole [name] 34 y.o. female presenting @ term in active labor. Intact membrane. Maternal Medical History:  Reason for admission: Contractions.   Fetal activity: Perceived fetal activity is normal.    Prenatal complications: no prenatal complications   OB History    Gravida Para Term Preterm AB TAB SAB Ectopic Multiple Living   4 2 1  1  1   2      Past Medical History  Diagnosis Date  . Anemia   . Depression     Post partum  . Anxiety   . PCOS (polycystic ovarian syndrome)   . H/O breast augmentation   . Oligomenorrhea   . Newborn product of IVF pregnancy   . Ruptured ovarian cyst   . Acute blood loss anemia 09/21/2012   Past Surgical History  Procedure Laterality Date  . Wisdom tooth extraction     Family History: family history is not on file. Social History:  reports that she has never smoked. She has never used smokeless tobacco. She reports that she does not drink alcohol or use illicit drugs.   Prenatal Transfer Tool  Maternal Diabetes: No Genetic Screening: Normal Maternal Ultrasounds/Referrals: Normal Fetal Ultrasounds or other Referrals:  None Maternal Substance Abuse:  No Significant Maternal Medications:  None Significant Maternal Lab Results:  Lab values include: Group B Strep negative Other Comments:  None  Review of Systems  All other systems reviewed and are negative.     unknown if currently breastfeeding. Maternal Exam:  Uterine Assessment: Contraction strength is moderate.  Contraction frequency is regular.   Abdomen: Estimated fetal weight is 7lb 2 oz.   Fetal presentation: vertex  Introitus: Vulva is positive for vulvar varicosities.   Physical Exam  Constitutional: She is oriented to person, place, and time. She appears well-developed and well-nourished.  HENT:  Head: Atraumatic.  Eyes: EOM are normal.  Neck: Neck supple.  Cardiovascular: Regular rhythm.   Respiratory: Breath sounds normal.  GI: Soft.  Neurological: She is  alert and oriented to person, place, and time.  Skin: Skin is warm and dry.  Psychiatric: She has Nicole [name] normal mood and affect.    Prenatal labs: ABO, Rh:  O positive Antibody:  neg Rubella:  Immune RPR:   NR HBsAg:   neg HIV:   NR GBS:   neg  Assessment/Plan: Labor  Term  P) admit routine lab. Epidural prn. Amniotomy prn   Nicole Nicole [name] 05/11/2015, 6:27 PM

## 2015-05-12 ENCOUNTER — Encounter (HOSPITAL_COMMUNITY): Payer: Self-pay | Admitting: *Deleted

## 2015-05-12 DIAGNOSIS — D62 Acute posthemorrhagic anemia: Secondary | ICD-10-CM | POA: Diagnosis not present

## 2015-05-12 LAB — CBC
HEMATOCRIT: 24.8 % — AB (ref 36.0–46.0)
Hemoglobin: 8.5 g/dL — ABNORMAL LOW (ref 12.0–15.0)
MCH: 32.6 pg (ref 26.0–34.0)
MCHC: 34.7 g/dL (ref 30.0–36.0)
MCV: 93.9 fL (ref 78.0–100.0)
Platelets: 148 10*3/uL — ABNORMAL LOW (ref 150–400)
RBC: 2.64 MIL/uL — ABNORMAL LOW (ref 3.87–5.11)
RDW: 13 % (ref 11.5–15.5)
WBC: 11.7 10*3/uL — AB (ref 4.0–10.5)

## 2015-05-12 LAB — RPR: RPR Ser Ql: NONREACTIVE

## 2015-05-12 MED ORDER — IBUPROFEN 600 MG PO TABS
600.0000 mg | ORAL_TABLET | Freq: Four times a day (QID) | ORAL | Status: DC
  Administered 2015-05-12 – 2015-05-13 (×5): 600 mg via ORAL
  Filled 2015-05-12 (×5): qty 1

## 2015-05-12 MED ORDER — SENNOSIDES-DOCUSATE SODIUM 8.6-50 MG PO TABS
2.0000 | ORAL_TABLET | ORAL | Status: DC
  Administered 2015-05-12: 2 via ORAL
  Filled 2015-05-12: qty 2

## 2015-05-12 MED ORDER — DIPHENHYDRAMINE HCL 25 MG PO CAPS
25.0000 mg | ORAL_CAPSULE | Freq: Four times a day (QID) | ORAL | Status: DC | PRN
Start: 2015-05-12 — End: 2015-05-13

## 2015-05-12 MED ORDER — ZOLPIDEM TARTRATE 5 MG PO TABS: 5.0000 mg | ORAL_TABLET | Freq: Every evening | ORAL | Status: DC | PRN

## 2015-05-12 MED ORDER — SIMETHICONE 80 MG PO CHEW: 80.0000 mg | CHEWABLE_TABLET | ORAL | Status: DC | PRN

## 2015-05-12 MED ORDER — ONDANSETRON HCL 4 MG/2ML IJ SOLN: 4.0000 mg | INTRAMUSCULAR | Status: DC | PRN

## 2015-05-12 MED ORDER — MAGNESIUM OXIDE 400 (241.3 MG) MG PO TABS
400.0000 mg | ORAL_TABLET | Freq: Every day | ORAL | Status: DC
  Administered 2015-05-12 – 2015-05-13 (×2): 400 mg via ORAL
  Filled 2015-05-12 (×2): qty 1

## 2015-05-12 MED ORDER — FERROUS SULFATE 325 (65 FE) MG PO TABS
325.0000 mg | ORAL_TABLET | Freq: Two times a day (BID) | ORAL | Status: DC
Start: 2015-05-12 — End: 2015-05-13
  Administered 2015-05-12 – 2015-05-13 (×2): 325 mg via ORAL
  Filled 2015-05-12 (×2): qty 1

## 2015-05-12 MED ORDER — LANOLIN HYDROUS EX OINT: TOPICAL_OINTMENT | CUTANEOUS | Status: DC | PRN

## 2015-05-12 MED ORDER — ACETAMINOPHEN 325 MG PO TABS: 650.0000 mg | ORAL_TABLET | ORAL | Status: DC | PRN

## 2015-05-12 MED ORDER — DIBUCAINE 1 % RE OINT
1.0000 | TOPICAL_OINTMENT | RECTAL | Status: DC | PRN
Start: 2015-05-12 — End: 2015-05-13

## 2015-05-12 MED ORDER — OXYCODONE-ACETAMINOPHEN 5-325 MG PO TABS
1.0000 | ORAL_TABLET | ORAL | Status: DC | PRN
  Administered 2015-05-12: 1 via ORAL
  Filled 2015-05-12: qty 1

## 2015-05-12 MED ORDER — WITCH HAZEL-GLYCERIN EX PADS: 1.0000 "application " | MEDICATED_PAD | CUTANEOUS | Status: DC | PRN

## 2015-05-12 MED ORDER — ONDANSETRON HCL 4 MG PO TABS: 4.0000 mg | ORAL_TABLET | ORAL | Status: DC | PRN

## 2015-05-12 MED ORDER — OXYCODONE-ACETAMINOPHEN 5-325 MG PO TABS
2.0000 | ORAL_TABLET | ORAL | Status: DC | PRN
  Administered 2015-05-12: 2 via ORAL
  Filled 2015-05-12: qty 2

## 2015-05-12 MED ORDER — BENZOCAINE-MENTHOL 20-0.5 % EX AERO: 1.0000 "application " | INHALATION_SPRAY | CUTANEOUS | Status: DC | PRN

## 2015-05-12 MED ORDER — PRENATAL MULTIVITAMIN CH
1.0000 | ORAL_TABLET | Freq: Every day | ORAL | Status: DC
  Administered 2015-05-12: 1 via ORAL
  Filled 2015-05-12: qty 1

## 2015-05-12 NOTE — Progress Notes (Signed)
Acknowledged order for social work consult regarding mother's hx anxiety and depression * Referral screened out by Clinical Social Worker because none of the following criteria appear to apply:  ~ History of anxiety/depression during this pregnancy, or of post-partum depression.  ~ Diagnosis of anxiety and/or depression within last 3 years  ~ History of depression due to pregnancy loss/loss of child  OR  * Patient's symptoms currently being treated.  RN reported that the MOB is bonding/interacting well with the infant. RN reported that MOB did not appear anxious/overwhelmed at this time.   Please contact the Clinical Social Worker if needs arise, or by the patient's request. 

## 2015-05-12 NOTE — Progress Notes (Signed)
PPD 1 SVD  S:  Reports feeling well - no sleep yet but still wired             Tolerating po/ No nausea or vomiting             Bleeding is light             Pain controlled with motrin             Up ad lib / ambulatory / voiding QS  Newborn breast feeding   O:               VS: BP 94/63 mmHg  Pulse 77  Temp(Src) 98.9 F (37.2 C) (Oral)  Resp 18  Ht 5\' 11"  (1.803 m)  Wt 71.215 kg (157 lb)  BMI 21.91 kg/m2  SpO2 100%  Breastfeeding? Unknown   LABS:              Recent Labs  05/11/15 1835 05/12/15 0558  WBC 9.6 11.7*  HGB 12.4 8.5*  PLT 177 148*               Blood type: --/--/O POS (05/27 1835)  Rubella: Immune (11/05 0000)                     I&O: Intake/Output      05/27 0701 - 05/28 0700 05/28 0701 - 05/29 0700   Urine (mL/kg/hr) 1000    Blood 726    Total Output 1726     Net -1726                        Physical Exam:             Alert and oriented X3  Abdomen: soft, non-tender, non-distended              Fundus: firm, non-tender, U-1  Perineum: no edema  Lochia: light  Extremities: no edema, no calf pain or tenderness, small peripheral varicosities and spider veins     A: PPD # 1              ABL anemia  Doing well - stable status  P: Routine post partum orders  Iron and magnesium x 4-6 weeks             anticipate DC tomorrow  Marlinda MikeBAILEY, Anisia Leija CNM, MSN, FACNM 05/12/2015, 10:05 AM

## 2015-05-12 NOTE — Lactation Note (Signed)
This note was copied from the chart of Nicole Randee Huston. Lactation Consultation Note: Lactation Brochure given to mother. Infant has been to the breast 8 times with 4 stools and no void at this time. Mother has a history of having sore nipples when breastfeeding other children. She states she breastfed and pumped for 2 months. Mother also has a history of PCOS and breast augmentation. She states she would like to start pumping now due to soreness. Mother denies having any cracking only tenderness. Mother was given a pump kit and a DEBP at the bedside. Mother father and infant are trying to nap at present. Mother will page when ready to start pumping. Mother was also given comfort gels. Advised mother to page when for [facility] or staff nurse to assist with next feeding.   Patient Name: Nicole [name] [id number] Date: 05/12/2015 Reason for consult: Initial assessment   Maternal Data Has patient been taught Hand Expression?: Yes Does the patient have breastfeeding experience prior to this delivery?: Yes  Feeding Length of feed: 15 min  LATCH Score/Interventions                      Lactation Tools Discussed/Used Pump Review: Setup, frequency, and cleaning;Milk Storage Initiated by:: Staff nurse Date initiated:: 05/12/15   Consult Status Consult Status: Follow-up Date: 05/12/15 Follow-up type: In-patient    Jess [facility] 05/12/2015, 11:04 AM

## 2015-05-12 NOTE — Anesthesia Postprocedure Evaluation (Signed)
Anesthesia Post Note  Patient: Nicole [name]  Procedure(s) Performed: * No procedures listed *  Anesthesia type: Epidural  Patient location: Mother/Baby  Post pain: Pain level controlled  Post assessment: Post-op Vital signs reviewed  Last Vitals:  Filed Vitals:   05/12/15 0800  BP: 94/63  Pulse: 77  Temp: 37.2 C  Resp: 18    Post vital signs: Reviewed  Level of consciousness:alert  Complications: No apparent anesthesia complications

## 2015-05-13 MED ORDER — NAPROXEN SODIUM 220 MG PO TABS: 220.0000 mg | ORAL_TABLET | Freq: Two times a day (BID) | ORAL | Status: DC

## 2015-05-13 MED ORDER — MAGNESIUM OXIDE 400 (241.3 MG) MG PO TABS: 400.0000 mg | ORAL_TABLET | Freq: Every day | ORAL | Status: DC

## 2015-05-13 MED ORDER — OXYCODONE-ACETAMINOPHEN 5-325 MG PO TABS: 1.0000 | ORAL_TABLET | ORAL | Status: DC | PRN

## 2015-05-13 MED ORDER — FERROUS SULFATE 325 (65 FE) MG PO TABS: 325.0000 mg | ORAL_TABLET | Freq: Two times a day (BID) | ORAL | Status: DC

## 2015-05-13 MED ORDER — CYCLOBENZAPRINE HCL 10 MG PO TABS: 10.0000 mg | ORAL_TABLET | Freq: Two times a day (BID) | ORAL | Status: DC

## 2015-05-13 NOTE — Discharge Summary (Signed)
Obstetric Discharge Summary  Reason for Admission: onset of labor Prenatal Procedures: none Intrapartum Procedures: spontaneous vaginal delivery Postpartum Procedures: none Complications-Operative and Postpartum: 1st degree perineal laceration HEMOGLOBIN  Date Value Ref Range Status  05/12/2015 8.5* 12.0 - 15.0 g/dL Final    Comment:    REPEATED TO VERIFY DELTA CHECK NOTED    HCT  Date Value Ref Range Status  05/12/2015 24.8* 36.0 - 46.0 % Final    Physical Exam:  General: alert, cooperative and no distress Lochia: appropriate Uterine Fundus: firm Perineum - no edema / 1st degree repair Left shoulder pain with (+) point tenderness c/w pinched nerve - positional DVT Evaluation: No evidence of DVT seen on physical exam.  Discharge Diagnoses: Term Pregnancy-delivered / acute blood loss anemia / shoulder (L) pain  Discharge Information: Date: 05/13/2015 Activity: pelvic rest Diet: routine Medications: PNV, FeSo4, magnesium, Aleve, flexeril, percocet Condition: stable Instructions: refer to practice specific booklet Discharge to: home Follow-up Information    Follow up with COUSINS,SHERONETTE A, MD. Schedule an appointment as soon as possible for a visit in 6 weeks.   Specialty:  Obstetrics and Gynecology   Contact information:   [address] [place] [id number] [phone]       Newborn Data: Live born female  Birth Weight: 7 lb 13.6 oz (3560 g) APGAR: 8, 9  Home with mother.  Marlinda MikeBAILEY, TANYA 05/13/2015, 10:56 AM

## 2015-05-13 NOTE — Progress Notes (Signed)
PPD 2 SVD  S:  Reports poorly this am due to persistent shoulder pain (left) unresolved with pain medication / heat / slightly better with removal of bra across shoulder             Tolerating po/ No nausea or vomiting             Bleeding is light             Pain controlled with motrin prior to shoulder pain but used percocet since             Up ad lib / ambulatory / voiding QS  Newborn bottle-feeding with formula now   O:               VS: BP 86/51 mmHg  Pulse 60  Temp(Src) 98.5 F (36.9 C) (Oral)  Resp 18  Ht 5\' 11"  (1.803 m)  Wt 71.215 kg (157 lb)  BMI 21.91 kg/m2  SpO2 100%  Breastfeeding? Unknown   LABS:              Recent Labs  05/11/15 1835 05/12/15 0558  WBC 9.6 11.7*  HGB 12.4 8.5*  PLT 177 148*               Blood type: --/--/O POS (05/27 1835)  Rubella: Immune (11/05 0000)                          Physical Exam:             Alert and oriented X3  Left shoulder - mobile / tenderness with palpation or contact (+) point tenderness  Abdomen: soft, non-tender, non-distended              Fundus: firm, non-tender, U-3  Perineum: no edema  Lochia: light  Extremities: no edema, no calf pain or tenderness    A: PPD # 2 SVD              ABL anemia             Shoulder pain most likely pinched nerve pain - positional & aggravated with weight & tension from bra straps  Doing well - stable status  P: Routine post partum orders  Iron and magnesium x 1 month             Flexeril and Aleve for shoulder pain / topical heat for comfort - call if no improvement in 48 hours or sooner if any worsening of pain              Engorgement / breast care for next 2-4 days - ok Ace bandage (4-5 inch) wrap for  Breast support and engorgement - to avoid bra straps across shoulder next 2-3 days  Marlinda MikeBAILEY, Iveth Heidemann CNM, MSN, [facility]ACNM 05/13/2015, 9:34 AM

## 2015-12-18 MED FILL — NORETHIND-ETH ESTRAD 1-0.02: 1-20 | 84 days supply | Qty: 63 | Fill #1

## 2016-01-31 DIAGNOSIS — Z13220 Encounter for screening for lipoid disorders: Secondary | ICD-10-CM | POA: Diagnosis not present

## 2016-01-31 DIAGNOSIS — Z0000 Encounter for general adult medical examination without abnormal findings: Secondary | ICD-10-CM | POA: Diagnosis not present

## 2016-01-31 DIAGNOSIS — R5383 Other fatigue: Secondary | ICD-10-CM | POA: Diagnosis not present

## 2016-02-08 DIAGNOSIS — Z0000 Encounter for general adult medical examination without abnormal findings: Secondary | ICD-10-CM | POA: Diagnosis not present

## 2016-02-08 DIAGNOSIS — R319 Hematuria, unspecified: Secondary | ICD-10-CM | POA: Diagnosis not present

## 2016-02-08 MED FILL — ESCITALOPRAM 20 MG TABLET: 20 | 30 days supply | Qty: 30 | Fill #5

## 2016-04-22 MED FILL — KETOCONAZOLE 2% SHAMPOO: 2 | 30 days supply | Qty: 120 | Fill #1

## 2016-04-22 MED FILL — ESCITALOPRAM 20 MG TABLET: 20 | 30 days supply | Qty: 30 | Fill #6

## 2016-04-22 MED FILL — CLOBETASOL 0.05% SHAMPOO: 0.05 | 29 days supply | Qty: 118 | Fill #1

## 2016-06-11 MED FILL — LARIN 21 1-20 TABLET: 1-20 | 84 days supply | Qty: 63 | Fill #2

## 2016-07-04 DIAGNOSIS — J0141 Acute recurrent pansinusitis: Secondary | ICD-10-CM | POA: Diagnosis not present

## 2016-08-22 MED FILL — ESCITALOPRAM 20 MG TABLET: 20 | 30 days supply | Qty: 30 | Fill #0

## 2016-10-08 DIAGNOSIS — F520 Hypoactive sexual desire disorder: Secondary | ICD-10-CM | POA: Diagnosis not present

## 2016-10-08 DIAGNOSIS — Z1321 Encounter for screening for nutritional disorder: Secondary | ICD-10-CM | POA: Diagnosis not present

## 2016-10-08 DIAGNOSIS — Z1151 Encounter for screening for human papillomavirus (HPV): Secondary | ICD-10-CM | POA: Diagnosis not present

## 2016-10-08 DIAGNOSIS — Z01419 Encounter for gynecological examination (general) (routine) without abnormal findings: Secondary | ICD-10-CM | POA: Diagnosis not present

## 2016-10-08 DIAGNOSIS — Z681 Body mass index (BMI) 19 or less, adult: Secondary | ICD-10-CM | POA: Diagnosis not present

## 2016-10-08 MED FILL — LARIN 21 1-20 TABLET: 1-20 | 63 days supply | Qty: 63 | Fill #0

## 2016-10-08 MED FILL — ESCITALOPRAM 10 MG TABLET: 10 | 30 days supply | Qty: 30 | Fill #0

## 2016-11-18 DIAGNOSIS — J0100 Acute maxillary sinusitis, unspecified: Secondary | ICD-10-CM | POA: Diagnosis not present

## 2016-11-18 DIAGNOSIS — J329 Chronic sinusitis, unspecified: Secondary | ICD-10-CM | POA: Diagnosis not present

## 2016-11-18 MED FILL — AMOX-CLAV 500-125 MG TABLET: 500-125 | 10 days supply | Qty: 20 | Fill #0

## 2016-12-16 MED FILL — ESCITALOPRAM 10 MG TABLET: 10 | 30 days supply | Qty: 30 | Fill #1

## 2017-01-28 MED FILL — ESCITALOPRAM 10 MG TABLET: 10 | 30 days supply | Qty: 30 | Fill #2

## 2017-02-16 DIAGNOSIS — J019 Acute sinusitis, unspecified: Secondary | ICD-10-CM | POA: Diagnosis not present

## 2017-02-16 MED FILL — AMOX-CLAV 875-125 MG TABLET: 875-125 | 10 days supply | Qty: 20 | Fill #0

## 2017-03-04 MED FILL — ESCITALOPRAM 10 MG TABLET: 10 | 30 days supply | Qty: 30 | Fill #3

## 2017-03-09 MED FILL — LARIN 21 1-20 TABLET: 1-20 | 63 days supply | Qty: 63 | Fill #1

## 2017-04-07 MED FILL — ESCITALOPRAM 10 MG TABLET: 10 | 30 days supply | Qty: 30 | Fill #4

## 2017-05-07 MED FILL — ESCITALOPRAM 10 MG TABLET: 10 | 30 days supply | Qty: 30 | Fill #5

## 2017-06-09 MED FILL — ESCITALOPRAM 10 MG TABLET: 10 | 30 days supply | Qty: 30 | Fill #6

## 2017-06-18 MED FILL — LARIN 21 1-20 TABLET: 1-20 | 63 days supply | Qty: 63 | Fill #2

## 2017-07-10 MED FILL — ESCITALOPRAM 10 MG TABLET: 10 | 30 days supply | Qty: 30 | Fill #7

## 2017-08-11 MED FILL — ESCITALOPRAM 10 MG TABLET: 10 | 30 days supply | Qty: 30 | Fill #8

## 2017-09-16 MED FILL — ESCITALOPRAM 10 MG TABLET: 10 | 30 days supply | Qty: 30 | Fill #9

## 2017-09-28 MED FILL — LARIN 21 1-20 TABLET: 1-20 | 63 days supply | Qty: 63 | Fill #3

## 2017-09-29 ENCOUNTER — Telehealth: Payer: 59 | Admitting: Family

## 2017-09-29 DIAGNOSIS — B9689 Other specified bacterial agents as the cause of diseases classified elsewhere: Secondary | ICD-10-CM | POA: Diagnosis not present

## 2017-09-29 DIAGNOSIS — J019 Acute sinusitis, unspecified: Secondary | ICD-10-CM

## 2017-09-29 MED ORDER — AMOXICILLIN-POT CLAVULANATE 875-125 MG PO TABS: 1.0000 | ORAL_TABLET | Freq: Two times a day (BID) | ORAL | 0 refills | Status: DC

## 2017-09-29 MED FILL — AMOX-CLAV 875-125 MG TABLET: 875-125 | 7 days supply | Qty: 14 | Fill #0

## 2017-09-29 NOTE — Progress Notes (Signed)

## 2017-10-28 MED FILL — ESCITALOPRAM 10 MG TABLET: 10 | 30 days supply | Qty: 30 | Fill #0

## 2017-12-30 MED FILL — LARIN 21 1-20 TABLET: 1-20 | 21 days supply | Qty: 21 | Fill #0

## 2018-01-18 ENCOUNTER — Telehealth: Payer: 59 | Admitting: Family

## 2018-01-18 DIAGNOSIS — B9689 Other specified bacterial agents as the cause of diseases classified elsewhere: Secondary | ICD-10-CM

## 2018-01-18 DIAGNOSIS — J329 Chronic sinusitis, unspecified: Secondary | ICD-10-CM

## 2018-01-18 MED ORDER — AMOXICILLIN-POT CLAVULANATE 875-125 MG PO TABS: 1.0000 | ORAL_TABLET | Freq: Two times a day (BID) | ORAL | 0 refills | Status: AC

## 2018-01-18 MED FILL — AMOX-CLAV 875-125 MG TABLET: 875-125 | 7 days supply | Qty: 14 | Fill #0

## 2018-01-18 NOTE — Progress Notes (Signed)

## 2018-02-24 MED FILL — LARIN 21 1-20 TABLET: 1-20 | 84 days supply | Qty: 63 | Fill #0

## 2018-03-25 MED FILL — ESCITALOPRAM 10 MG TABLET: 10 | 90 days supply | Qty: 90 | Fill #0

## 2018-05-27 MED FILL — LARIN 21 1-20 TABLET: 1-20 | 84 days supply | Qty: 63 | Fill #1

## 2018-06-16 MED FILL — ESCITALOPRAM 10 MG TABLET: 10 | 90 days supply | Qty: 90 | Fill #1

## 2018-08-10 ENCOUNTER — Telehealth: Payer: 59 | Admitting: Physician Assistant

## 2018-08-10 DIAGNOSIS — B9689 Other specified bacterial agents as the cause of diseases classified elsewhere: Secondary | ICD-10-CM

## 2018-08-10 DIAGNOSIS — J019 Acute sinusitis, unspecified: Secondary | ICD-10-CM | POA: Diagnosis not present

## 2018-08-10 MED ORDER — AMOXICILLIN-POT CLAVULANATE 875-125 MG PO TABS: 1.0000 | ORAL_TABLET | Freq: Two times a day (BID) | ORAL | 0 refills | Status: DC

## 2018-08-10 MED FILL — AMOX-CLAV 875-125 MG TABLET: 875-125 | 7 days supply | Qty: 14 | Fill #0

## 2018-08-10 NOTE — Progress Notes (Signed)

## 2018-08-23 MED FILL — LARIN 21 1-20 TABLET: 1-20 | 84 days supply | Qty: 63 | Fill #2

## 2018-09-16 MED FILL — ESCITALOPRAM 10 MG TABLET: 10 | 90 days supply | Qty: 90 | Fill #0

## 2018-11-17 ENCOUNTER — Ambulatory Visit (INDEPENDENT_AMBULATORY_CARE_PROVIDER_SITE_OTHER): Payer: Self-pay | Admitting: Nurse Practitioner

## 2018-11-17 VITALS — BP 100/70 | HR 67 | Temp 98.3°F | Resp 18 | Wt 140.8 lb

## 2018-11-17 DIAGNOSIS — J209 Acute bronchitis, unspecified: Secondary | ICD-10-CM

## 2018-11-17 MED ORDER — PROMETHAZINE-DM 6.25-15 MG/5ML PO SYRP: 5.0000 mL | ORAL_SOLUTION | Freq: Four times a day (QID) | ORAL | 0 refills | Status: AC | PRN

## 2018-11-17 MED ORDER — PREDNISONE 10 MG (21) PO TBPK: ORAL_TABLET | ORAL | 0 refills | Status: AC

## 2018-11-17 MED ORDER — BENZONATATE 200 MG PO CAPS: 200.0000 mg | ORAL_CAPSULE | Freq: Three times a day (TID) | ORAL | 0 refills | Status: AC | PRN

## 2018-11-17 MED FILL — BENZONATATE 200 MG CAPS: 200 | 10 days supply | Qty: 30 | Fill #0

## 2018-11-17 MED FILL — PROMETHAZINE W/DM SYRUP: 6.25-15 | 7 days supply | Qty: 140 | Fill #0

## 2018-11-17 MED FILL — predniSONE 10 MG TABS: 10 | 6 days supply | Qty: 21 | Fill #0

## 2018-11-17 NOTE — Patient Instructions (Signed)
Acute Bronchitis, Adult -Take medication as prescribed. -Ibuprofen or Tylenol for pain, fever, or general discomfort. -Increase fluids. -Sleep elevated on at least 2 pillows at bedtime to help with cough. -Use a humidifier or vaporizer when at home and during sleep to help with cough. -May use a teaspoon of honey or over-the-counter cough drops to help with cough. -Follow-up if symptoms do not improve.  Acute bronchitis is sudden (acute) swelling of the air tubes (bronchi) in the lungs. Acute bronchitis causes these tubes to fill with mucus, which can make it hard to breathe. It can also cause coughing or wheezing. In adults, acute bronchitis usually goes away within 2 weeks. A cough caused by bronchitis may last up to 3 weeks. Smoking, allergies, and asthma can make the condition worse. Repeated episodes of bronchitis may cause further lung problems, such as chronic obstructive pulmonary disease (COPD). What are the causes? This condition can be caused by germs and by substances that irritate the lungs, including:  Cold and flu viruses. This condition is most often caused by the same virus that causes a cold.  Bacteria.  Exposure to tobacco smoke, dust, fumes, and air pollution.  What increases the risk? This condition is more likely to develop in people who:  Have close contact with someone with acute bronchitis.  Are exposed to lung irritants, such as tobacco smoke, dust, fumes, and vapors.  Have a weak immune system.  Have a respiratory condition such as asthma.  What are the signs or symptoms? Symptoms of this condition include:  A cough.  Coughing up clear, yellow, or green mucus.  Wheezing.  Chest congestion.  Shortness of breath.  A fever.  Body aches.  Chills.  A sore throat.  How is this diagnosed? This condition is usually diagnosed with a physical exam. During the exam, your health care provider may order tests, such as chest X-rays, to rule out other  conditions. He or she may also:  Test a sample of your mucus for bacterial infection.  Check the level of oxygen in your blood. This is done to check for pneumonia.  Do a chest X-ray or lung function testing to rule out pneumonia and other conditions.  Perform blood tests.  Your health care provider will also ask about your symptoms and medical history. How is this treated? Most cases of acute bronchitis clear up over time without treatment. Your health care provider may recommend:  Drinking more fluids. Drinking more makes your mucus thinner, which may make it easier to breathe.  Taking a medicine for a fever or cough.  Taking an antibiotic medicine.  Using an inhaler to help improve shortness of breath and to control a cough.  Using a cool mist vaporizer or humidifier to make it easier to breathe.  Follow these instructions at home: Medicines  Take over-the-counter and prescription medicines only as told by your health care provider.  If you were prescribed an antibiotic, take it as told by your health care provider. Do not stop taking the antibiotic even if you start to feel better. General instructions  Get plenty of rest.  Drink enough fluids to keep your urine clear or pale yellow.  Avoid smoking and secondhand smoke. Exposure to cigarette smoke or irritating chemicals will make bronchitis worse. If you smoke and you need help quitting, ask your health care provider. Quitting smoking will help your lungs heal faster.  Use an inhaler, cool mist vaporizer, or humidifier as told by your health care provider.    Keep all follow-up visits as told by your health care provider. This is important. How is this prevented? To lower your risk of getting this condition again:  Wash your hands often with soap and water. If soap and water are not available, use hand sanitizer.  Avoid contact with people who have cold symptoms.  Try not to touch your hands to your mouth, nose, or  eyes.  Make sure to get the flu shot every year.  Contact a health care provider if:  Your symptoms do not improve in 2 weeks of treatment. Get help right away if:  You cough up blood.  You have chest pain.  You have severe shortness of breath.  You become dehydrated.  You faint or keep feeling like you are going to faint.  You keep vomiting.  You have a severe headache.  Your fever or chills gets worse. This information is not intended to replace advice given to you by your health care provider. Make sure you discuss any questions you have with your health care provider. Document Released: 01/08/2005 Document Revised: 06/25/2016 Document Reviewed: 05/21/2016 Elsevier Interactive Patient Education  2018 [employer]  

## 2018-11-17 NOTE — Progress Notes (Signed)
Subjective:    Patient ID: Nicole [name], female    DOB: Sep 21, 1981, 37 y.o.   MRN: [id number]  The patient is a 37 year old female who presents today for complaints of cough.  The patient states she has not been feeling well for the past 3 weeks.  Patient states she started off with sinus symptoms to include sinus pressure, headache, and sore throat.  To date the symptoms have resolved, but patient does remain with a cough that is productive of greenish-yellow sputum, that changes to clear throughout the day.  The patient states that she has been taking Mucinex DM for her cough which is helped.  Patient states she has been coughing more since she started the medication.  The patient does have a history of recurrent sinusitis and seasonal allergies.  Cough  This is a new problem. The current episode started 1 to 4 weeks ago. The problem has been gradually improving. The problem occurs hourly. The cough is productive of sputum. Associated symptoms include headaches. Pertinent negatives include no ear congestion, ear pain, fever, nasal congestion, postnasal drip, sore throat, shortness of breath or wheezing. The symptoms are aggravated by lying down. She has tried OTC cough suppressant for the symptoms. The treatment provided moderate relief. Her past medical history is significant for environmental allergies. There is no history of asthma, emphysema or pneumonia.  Headache   Associated symptoms include coughing. Pertinent negatives include no dizziness, ear pain, fever, sore throat or weakness.  Sore Throat   Associated symptoms include coughing and headaches. Pertinent negatives include no ear pain or shortness of breath.    Review of Systems  Constitutional: Positive for fatigue. Negative for activity change, appetite change and fever.  HENT: Negative for ear pain, postnasal drip and sore throat.   Respiratory: Positive for cough and chest tightness. Negative for shortness of breath and  wheezing.   Cardiovascular: Negative.   Gastrointestinal: Negative.   Skin: Negative.   Allergic/Immunologic: Positive for environmental allergies.  Neurological: Positive for headaches. Negative for dizziness, tremors, speech difficulty, weakness and light-headedness.      Objective:   Physical Exam  Constitutional: She is oriented to person, place, and time. She appears well-developed and well-nourished. She does not appear ill.  HENT:  Head: Normocephalic.  Mouth/Throat: Oropharynx is clear and moist.  Eyes: Pupils are equal, round, and reactive to light.  Neck: Normal range of motion. Neck supple. No tracheal deviation present.  Cardiovascular: Normal rate, regular rhythm and normal heart sounds.  Pulmonary/Chest: Effort normal and breath sounds normal. No respiratory distress. She has no wheezes. She exhibits no tenderness.  Abdominal: Soft. Bowel sounds are normal.  Neurological: She is alert and oriented to person, place, and time. No cranial nerve deficit.  Skin: Skin is warm and dry.  Psychiatric: She has a normal mood and affect.      Assessment & Plan:   Exam findings, diagnosis etiology and medication use and indications reviewed with patient. Follow- Up and discharge instructions provided. No emergent/urgent issues found on exam.  Discussed with patient that antibiotics were not needed at this time due to approximately 90% of bronchitis diagnoses being viral.  Informed patient that based on her clinical presentation and symptomology to include improvement of her symptoms, did not feel antibiotics were warranted at this time.  Discussed with patient that if symptoms worsen to include high fever, worsening cough, change in sputum production or color, to return to the clinic.  Further informed patient that even if  her symptoms improve the cough that she has may continue to linger up to 3 to 4 weeks.  Patient education was provided.  Patient verbalized understanding of information  provided and agrees with plan of care (POC), all questions answered. The patient is advised to call or return to clinic if condition does not see an improvement in symptoms, or to seek the care of the closest emergency department if condition worsens with the above plan.   1. Acute bronchitis, unspecified organism  - predniSONE (STERAPRED UNI-PAK 21 TAB) 10 MG (21) TBPK tablet; Take as directed.  Dispense: 21 tablet; Refill: 0 - benzonatate (TESSALON) 200 MG capsule; Take 1 capsule (200 mg total) by mouth 3 (three) times daily as needed for up to 10 days for cough.  Dispense: 30 capsule; Refill: 0 - promethazine-dextromethorphan (PROMETHAZINE-DM) 6.25-15 MG/5ML syrup; Take 5 mLs by mouth 4 (four) times daily as needed for up to 7 days for cough.  Dispense: 140 mL; Refill: 0 -Take medication as prescribed. -Ibuprofen or Tylenol for pain, fever, or general discomfort. -Increase fluids. -Sleep elevated on at least 2 pillows at bedtime to help with cough. -Use a humidifier or vaporizer when at home and during sleep to help with cough. -May use a teaspoon of honey or over-the-counter cough drops to help with cough. -Follow-up if symptoms do not improve.

## 2018-11-25 MED FILL — AZITHROMYCIN 250 MG TABLET: 250 | 5 days supply | Qty: 6 | Fill #0

## 2018-11-25 MED FILL — SYMBICORT 160-4.5 MCG INH: 160-4.5 | 30 days supply | Qty: 10 | Fill #0

## 2018-11-25 MED FILL — MONTELUKAST SOD 10 MG TAB: 10 | 30 days supply | Qty: 30 | Fill #0

## 2018-11-29 MED FILL — LARIN 21 1-20 TABLET: 1-20 | 84 days supply | Qty: 63 | Fill #3

## 2018-12-16 MED FILL — ESCITALOPRAM 10 MG TABLET: 10 | 90 days supply | Qty: 90 | Fill #0

## 2019-02-10 MED FILL — MONTELUKAST SOD 10 MG TAB: 10 | 30 days supply | Qty: 30 | Fill #1 | Status: TO

## 2019-02-24 MED FILL — LARIN 21 1-20 TABLET: 1-20 | 63 days supply | Qty: 63 | Fill #0

## 2019-03-14 MED FILL — ESCITALOPRAM 10 MG TABLET: 10 | 90 days supply | Qty: 90 | Fill #0

## 2019-03-14 MED FILL — MONTELUKAST SOD 10 MG TAB: 10 | 30 days supply | Qty: 30 | Fill #0

## 2019-04-14 MED FILL — MONTELUKAST SOD 10 MG TAB: 10 | 30 days supply | Qty: 30 | Fill #1 | Status: TO

## 2019-05-23 MED FILL — MONTELUKAST SOD 10 MG TAB: 10 | 90 days supply | Qty: 90 | Fill #0

## 2019-06-27 ENCOUNTER — Encounter: Payer: Self-pay | Admitting: Physician Assistant

## 2019-06-27 ENCOUNTER — Telehealth: Payer: No Typology Code available for payment source | Admitting: Physician Assistant

## 2019-06-27 DIAGNOSIS — B9689 Other specified bacterial agents as the cause of diseases classified elsewhere: Secondary | ICD-10-CM

## 2019-06-27 DIAGNOSIS — J019 Acute sinusitis, unspecified: Secondary | ICD-10-CM | POA: Diagnosis not present

## 2019-06-27 MED ORDER — AMOXICILLIN-POT CLAVULANATE 875-125 MG PO TABS
1.0000 | ORAL_TABLET | Freq: Two times a day (BID) | ORAL | 0 refills | Status: DC
Start: 2019-06-27 — End: 2022-06-28

## 2019-06-27 MED FILL — AMOX-CLAV 875-125 MG TABLET: 875-125 | 7 days supply | Qty: 14 | Fill #0

## 2019-06-27 MED FILL — ESCITALOPRAM 10 MG TABLET: 10 | 90 days supply | Qty: 90 | Fill #0

## 2019-06-27 NOTE — Progress Notes (Signed)
We are sorry that you are not feeling well.  Here is how we plan to help!  Based on what you have shared with me it looks like you have sinusitis.  Sinusitis is inflammation and infection in the sinus cavities of the head.  Based on your presentation I believe you most likely have Acute Bacterial Sinusitis.  This is an infection caused by bacteria and is treated with antibiotics. I have prescribed Augmentin 875mg/125mg one tablet twice daily with food, for 7 days. You may use an oral decongestant such as Mucinex D or if you have glaucoma or high blood pressure use plain Mucinex. Saline nasal spray help and can safely be used as often as needed for congestion.  If you develop worsening sinus pain, fever or notice severe headache and vision changes, or if symptoms are not better after completion of antibiotic, please schedule an appointment with a health care provider.    Sinus infections are not as easily transmitted as other respiratory infection, however we still recommend that you avoid close contact with loved ones, especially the very young and elderly.  Remember to wash your hands thoroughly throughout the day as this is the number one way to prevent the spread of infection!  Home Care:  Only take medications as instructed by your medical team.  Complete the entire course of an antibiotic.  Do not take these medications with alcohol.  A steam or ultrasonic humidifier can help congestion.  You can place a towel over your head and breathe in the steam from hot water coming from a faucet.  Avoid close contacts especially the very young and the elderly.  Cover your mouth when you cough or sneeze.  Always remember to wash your hands.  Get Help Right Away If:  You develop worsening fever or sinus pain.  You develop a severe head ache or visual changes.  Your symptoms persist after you have completed your treatment plan.  Make sure you  Understand these instructions.  Will watch your  condition.  Will get help right away if you are not doing well or get worse.  Your e-visit answers were reviewed by a board certified advanced clinical practitioner to complete your personal care plan.  Depending on the condition, your plan could have included both over the counter or prescription medications.  If there is a problem please reply  once you have received a response from your provider.  Your safety is important to us.  If you have drug allergies check your prescription carefully.    You can use MyChart to ask questions about today's visit, request a non-urgent call back, or ask for a work or school excuse for 24 hours related to this e-Visit. If it has been greater than 24 hours you will need to follow up with your provider, or enter a new e-Visit to address those concerns.  You will get an e-mail in the next two days asking about your experience.  I hope that your e-visit has been valuable and will speed your recovery. Thank you for using e-visits.   I spent 5-10 minutes on review and completion of this note- Sahar Osman PAC  

## 2019-08-30 MED FILL — MONTELUKAST SOD 10 MG TAB: 10 | 90 days supply | Qty: 90 | Fill #1

## 2019-09-06 MED FILL — traMADol HCL 50 MG TABS: 50 | 25 days supply | Qty: 100 | Fill #0

## 2019-10-11 MED FILL — ESCITALOPRAM 10 MG TABLET: 10 | 90 days supply | Qty: 90 | Fill #0

## 2019-12-19 MED FILL — IVERMECTIN 3 MG TABLET: 3 | 14 days supply | Qty: 2 | Fill #0

## 2019-12-26 MED FILL — MONTELUKAST SOD 10 MG TAB: 10 | 90 days supply | Qty: 90 | Fill #0

## 2019-12-26 MED FILL — ESCITALOPRAM 10 MG TABLET: 10 | 90 days supply | Qty: 90 | Fill #0

## 2020-04-03 MED FILL — MONTELUKAST SOD 10 MG TAB: 10 | 90 days supply | Qty: 90 | Fill #1

## 2020-04-16 MED FILL — ESCITALOPRAM 10 MG TABLET: 10 | 90 days supply | Qty: 90 | Fill #1

## 2020-07-30 MED FILL — ESCITALOPRAM 10 MG TABLET: 10 | 90 days supply | Qty: 90 | Fill #2

## 2020-11-02 MED FILL — ESCITALOPRAM 10 MG TABLET: 10 | 90 days supply | Qty: 90 | Fill #3

## 2021-01-24 ENCOUNTER — Other Ambulatory Visit (HOSPITAL_COMMUNITY): Payer: Self-pay | Admitting: Internal Medicine

## 2021-01-25 MED FILL — LEVOTHYROXINE 50 MCG TABLET: 50 | 90 days supply | Qty: 90 | Fill #0

## 2021-02-11 ENCOUNTER — Other Ambulatory Visit (HOSPITAL_COMMUNITY): Payer: Self-pay | Admitting: Internal Medicine

## 2021-02-11 MED FILL — ESCITALOPRAM 10 MG TABLET: 10 | 90 days supply | Qty: 90 | Fill #0

## 2021-03-15 ENCOUNTER — Other Ambulatory Visit (HOSPITAL_COMMUNITY): Payer: Self-pay | Admitting: Family Medicine

## 2021-03-15 MED FILL — predniSONE 10 MG TABS: 10 | 5 days supply | Qty: 15 | Fill #0

## 2021-04-30 MED FILL — Levothyroxine Sodium Tab 50 MCG: ORAL | 90 days supply | Qty: 90 | Fill #0 | Status: AC

## 2021-05-01 ENCOUNTER — Other Ambulatory Visit (HOSPITAL_COMMUNITY): Payer: Self-pay

## 2021-05-02 ENCOUNTER — Other Ambulatory Visit (HOSPITAL_COMMUNITY): Payer: Self-pay

## 2021-05-19 MED FILL — Escitalopram Oxalate Tab 10 MG (Base Equiv): ORAL | 90 days supply | Qty: 90 | Fill #0 | Status: AC

## 2021-05-20 ENCOUNTER — Other Ambulatory Visit (HOSPITAL_COMMUNITY): Payer: Self-pay

## 2021-08-05 ENCOUNTER — Other Ambulatory Visit (HOSPITAL_COMMUNITY): Payer: Self-pay

## 2021-08-05 MED ORDER — LEVOTHYROXINE SODIUM 50 MCG PO TABS
50.0000 ug | ORAL_TABLET | Freq: Every morning | ORAL | 1 refills | Status: DC
  Filled 2021-08-05: qty 90, 90d supply, fill #0
  Filled 2021-11-11: qty 90, 90d supply, fill #1

## 2021-08-05 MED ORDER — LEVOTHYROXINE SODIUM 50 MCG PO CAPS: 50.0000 ug | ORAL_CAPSULE | Freq: Every morning | ORAL | 1 refills | Status: DC

## 2021-08-21 ENCOUNTER — Other Ambulatory Visit (HOSPITAL_COMMUNITY): Payer: Self-pay

## 2021-08-21 MED FILL — Escitalopram Oxalate Tab 10 MG (Base Equiv): ORAL | 90 days supply | Qty: 90 | Fill #1 | Status: AC

## 2021-11-11 ENCOUNTER — Other Ambulatory Visit (HOSPITAL_COMMUNITY): Payer: Self-pay

## 2021-11-11 MED FILL — Escitalopram Oxalate Tab 10 MG (Base Equiv): ORAL | 90 days supply | Qty: 90 | Fill #2 | Status: AC

## 2022-01-27 ENCOUNTER — Other Ambulatory Visit (HOSPITAL_COMMUNITY): Payer: Self-pay

## 2022-01-27 MED ORDER — ESCITALOPRAM OXALATE 10 MG PO TABS
10.0000 mg | ORAL_TABLET | Freq: Every day | ORAL | 3 refills | Status: DC
  Filled 2022-01-27: qty 90, 90d supply, fill #0

## 2022-01-27 MED ORDER — BUPROPION HCL ER (XL) 150 MG PO TB24
150.0000 mg | ORAL_TABLET | Freq: Every morning | ORAL | 0 refills | Status: DC
  Filled 2022-01-27: qty 30, 30d supply, fill #0

## 2022-02-10 ENCOUNTER — Other Ambulatory Visit (HOSPITAL_COMMUNITY): Payer: Self-pay

## 2022-02-10 MED ORDER — LEVOTHYROXINE SODIUM 50 MCG PO TABS
50.0000 ug | ORAL_TABLET | ORAL | 1 refills | Status: DC
  Filled 2022-02-10: qty 90, 90d supply, fill #0
  Filled 2022-05-12: qty 90, 90d supply, fill #1

## 2022-02-20 ENCOUNTER — Other Ambulatory Visit (HOSPITAL_COMMUNITY): Payer: Self-pay

## 2022-02-20 MED ORDER — CARESTART COVID-19 HOME TEST VI KIT
PACK | 0 refills | Status: DC
  Filled 2022-02-20: qty 4, 4d supply, fill #0

## 2022-02-24 ENCOUNTER — Other Ambulatory Visit (HOSPITAL_COMMUNITY): Payer: Self-pay

## 2022-02-24 MED ORDER — BUPROPION HCL ER (XL) 150 MG PO TB24
150.0000 mg | ORAL_TABLET | Freq: Every morning | ORAL | 3 refills | Status: DC
  Filled 2022-02-24: qty 90, 90d supply, fill #0
  Filled 2022-05-29: qty 90, 90d supply, fill #1

## 2022-02-26 ENCOUNTER — Other Ambulatory Visit (HOSPITAL_COMMUNITY): Payer: Self-pay

## 2022-03-07 ENCOUNTER — Other Ambulatory Visit (HOSPITAL_COMMUNITY): Payer: Self-pay

## 2022-03-07 MED ORDER — METHOCARBAMOL 500 MG PO TABS
500.0000 mg | ORAL_TABLET | Freq: Every day | ORAL | 0 refills | Status: DC
  Filled 2022-03-07: qty 30, 10d supply, fill #0

## 2022-03-07 MED ORDER — PREDNISONE 10 MG PO TABS
ORAL_TABLET | ORAL | 0 refills | Status: AC
  Filled 2022-03-07: qty 42, 12d supply, fill #0

## 2022-05-13 ENCOUNTER — Other Ambulatory Visit (HOSPITAL_COMMUNITY): Payer: Self-pay

## 2022-05-29 ENCOUNTER — Other Ambulatory Visit (HOSPITAL_COMMUNITY): Payer: Self-pay

## 2022-06-24 ENCOUNTER — Other Ambulatory Visit (HOSPITAL_COMMUNITY): Payer: Self-pay

## 2022-06-24 MED ORDER — SERTRALINE HCL 50 MG PO TABS
50.0000 mg | ORAL_TABLET | Freq: Every day | ORAL | 0 refills | Status: DC
  Filled 2022-06-24: qty 30, 30d supply, fill #0

## 2022-06-28 ENCOUNTER — Emergency Department (INDEPENDENT_AMBULATORY_CARE_PROVIDER_SITE_OTHER): Payer: No Typology Code available for payment source

## 2022-06-28 ENCOUNTER — Other Ambulatory Visit: Payer: Self-pay

## 2022-06-28 ENCOUNTER — Ambulatory Visit
Admission: EM | Admit: 2022-06-28 | Discharge: 2022-06-28 | Discharge status: Against Medical Advice | Payer: No Typology Code available for payment source

## 2022-06-28 ENCOUNTER — Emergency Department
Admission: EM | Admit: 2022-06-28 | Discharge: 2022-06-28 | Disposition: A | Discharge status: Home / Self Care | Payer: No Typology Code available for payment source | Attending: Family Medicine | Admitting: Family Medicine

## 2022-06-28 DIAGNOSIS — W19XXXA Unspecified fall, initial encounter: Secondary | ICD-10-CM

## 2022-06-28 DIAGNOSIS — S62111A Displaced fracture of triquetrum [cuneiform] bone, right wrist, initial encounter for closed fracture: Secondary | ICD-10-CM | POA: Diagnosis not present

## 2022-06-28 DIAGNOSIS — S6991XA Unspecified injury of right wrist, hand and finger(s), initial encounter: Secondary | ICD-10-CM | POA: Diagnosis not present

## 2022-06-28 MED ORDER — CELECOXIB 100 MG PO CAPS: 100.0000 mg | ORAL_CAPSULE | Freq: Two times a day (BID) | ORAL | 0 refills | Status: AC

## 2022-06-28 MED ORDER — OXYCODONE-ACETAMINOPHEN 5-325 MG PO TABS: 1.0000 | ORAL_TABLET | Freq: Every day | ORAL | 0 refills | Status: DC | PRN

## 2022-06-28 NOTE — Discharge Instructions (Addendum)
Advised/informed patient of right wrist x-ray results.  Advised/encouraged patient to wear right wrist Ace wrap and to immobilize right wrist completely until being evaluated by orthopedic/sports medicine provider.  Advised patient to follow-up with [facility] orthopedic provider first thing Monday morning, 06/30/2022.  Contact information is provided below.  Advised patient to take Celebrex as directed with food to completion.  Advised may take Percocet for breakthrough pain daily, as needed.  Encouraged patient to increase daily water intake while taking these medications.

## 2022-06-28 NOTE — ED Triage Notes (Addendum)
Pt states that she fell and injured her right wrist. X1 day

## 2022-06-28 NOTE — ED Provider Notes (Signed)
[name]    CSN: [id number] Arrival date & time: 06/28/22  1128      History   Chief Complaint Chief Complaint  Patient presents with   Wrist Injury    Right wrist injury. X1 day    HPI Nicole [name] is a 41 y.o. female.   HPI Pleasant 41 year old female presents with right wrist pain for 1 day secondary to fall/injury.  PMH significant for anemia, anxiety, and PCOS.  Past Medical History:  Diagnosis Date   Acute blood loss anemia 09/21/2012   Anemia    Anxiety    Depression    Post partum   H/O breast augmentation    Newborn product of IVF pregnancy    Oligomenorrhea    PCOS (polycystic ovarian syndrome)    Ruptured ovarian cyst     Patient Active Problem List   Diagnosis Date Noted   Acute blood loss anemia 05/12/2015   Postpartum care following vaginal delivery (5/27) 05/11/2015    Past Surgical History:  Procedure Laterality Date   WISDOM TOOTH EXTRACTION      OB History     Gravida  4   Para  3   Term  2   Preterm      AB  1   Living  3      SAB  1   IAB      Ectopic      Multiple  0   Live Births  3            Home Medications    Prior to Admission medications   Medication Sig Start Date End Date Taking? Authorizing Provider  CALCIUM MAGNESIUM 750 PO Take by mouth.   Yes [provider]  celecoxib (CELEBREX) 100 MG capsule Take 1 capsule (100 mg total) by mouth 2 (two) times daily for 15 days. 06/28/22 07/13/22 Yes Eliezer Lofts, FNP  Cetirizine HCl (ZYRTEC ALLERGY PO) Take by mouth.   Yes [provider]  levothyroxine (SYNTHROID) 50 MCG tablet Take 1 tablet (50 mcg total) by mouth every morning on empty stomach. 02/10/22  Yes   Multiple Vitamin (MULTIVITAMIN) tablet Take 1 tablet by mouth daily.   Yes [provider]  oxyCODONE-acetaminophen (PERCOCET/ROXICET) 5-325 MG tablet Take 1-2 tablets by mouth daily as needed for severe pain. 06/28/22  Yes Eliezer Lofts, FNP  [place]  Calcium 500 MG TABS 1 tablet with meals   Yes [provider]  sertraline (ZOLOFT) 50 MG tablet Take 1 tablet (50 mg total) by mouth daily. 06/24/22  Yes   buPROPion (WELLBUTRIN XL) 150 MG 24 hr tablet Take 1 tablet (150 mg total) by mouth in the morning. 02/24/22     COVID-19 At Home Antigen Test [facility] COVID-19 HOME TEST) KIT USE AS DIRECTED 02/20/22   Jefm Bryant, RPH  cyclobenzaprine (FLEXERIL) 10 MG tablet Take 1 tablet (10 mg total) by mouth 2 (two) times daily. Take at [facility] for shoulder pain and may repeat in daytime x 1 for persistent pain Patient not taking: Reported on 11/17/2018 05/13/15   Artelia Laroche, CNM  ferrous sulfate 325 (65 FE) MG tablet Take 1 tablet (325 mg total) by mouth 2 (two) times daily with a meal. Patient not taking: Reported on 11/17/2018 05/13/15   Artelia Laroche, CNM  levothyroxine (SYNTHROID) 50 MCG tablet TAKE 1 TABLET BY MOUTH ONCE DAILY IN THE MORNING ON AN EMPTY STOMACH. 01/24/21 01/24/22  Deland Pretty, MD  norethindrone-ethinyl estradiol (JUNEL FE 1/20)  1-20 MG-MCG tablet  02/24/14   [provider]  pseudoephedrine-guaifenesin (MUCINEX D) 60-600 MG 12 hr tablet Take 1 tablet by mouth every 12 (twelve) hours.    [provider]    Family History History reviewed. No pertinent family history.  Social History Social History   Tobacco Use   Smoking status: Never   Smokeless tobacco: Never  Substance Use Topics   Alcohol use: Yes    Comment: occ   Drug use: No     Allergies   Patient has no known allergies.   Review of Systems Review of Systems  Musculoskeletal:        Right wrist pain secondary to injury/fall 1 day ago     Physical Exam Triage Vital Signs ED Triage Vitals  Enc Vitals Group     BP 06/28/22 1156 98/65     Pulse Rate 06/28/22 1156 84     Resp 06/28/22 1156 20     Temp 06/28/22 1156 99.4 F (37.4 C)     Temp Source 06/28/22 1156 Oral     SpO2 06/28/22 1156 97 %     Weight 06/28/22 1151 140 lb  (63.5 kg)     Height 06/28/22 1151 $RemoveBefor'5\' 10"'ZSBYrqdYDFht$  (1.778 m)     Head Circumference --      Peak Flow --      Pain Score 06/28/22 1151 8     Pain Loc --      Pain Edu? --      Excl. in [place]? --    No data found.  Updated Vital Signs BP 98/65 (BP Location: Left Arm)   Pulse 84   Temp 99.4 F (37.4 C) (Oral)   Resp 20   Ht $R'5\' 10"'Sy$  (1.778 m)   Wt 140 lb (63.5 kg)   LMP 06/12/2022   SpO2 97%   BMI 20.09 kg/m    Physical Exam Vitals and nursing note reviewed.  Constitutional:      Appearance: Normal appearance. She is normal weight.  HENT:     Head: Normocephalic and atraumatic.     Mouth/Throat:     Mouth: Mucous membranes are moist.     Pharynx: Oropharynx is clear.  Eyes:     Extraocular Movements: Extraocular movements intact.     Conjunctiva/sclera: Conjunctivae normal.     Pupils: Pupils are equal, round, and reactive to light.  Cardiovascular:     Rate and Rhythm: Normal rate and regular rhythm.     Pulses: Normal pulses.     Heart sounds: Normal heart sounds. No murmur heard. Pulmonary:     Effort: Pulmonary effort is normal.     Breath sounds: Normal breath sounds. No wheezing, rhonchi or rales.  Musculoskeletal:     Cervical back: Normal range of motion and neck supple.     Comments: Right wrist: TTP over, limited range of motion with flexion/extension/ulnar deviation, grip 3/5, neurosensory/neurovascular intact, brisk cap refill  Skin:    General: Skin is warm and dry.  Neurological:     General: No focal deficit present.     Mental Status: She is alert and oriented to person, place, and time.      UC Treatments / Results  Labs (all labs ordered are listed, but only abnormal results are displayed) Labs Reviewed - No data to display  EKG   Radiology DG Wrist Complete Right  Result Date: 06/28/2022 CLINICAL DATA:  41 year old female with history of trauma from a fall head injury to the  right wrist. EXAM: RIGHT WRIST - COMPLETE 3+ VIEW COMPARISON:  None  Available. FINDINGS: Lateral view demonstrates an acute fracture through the triquetrum with overlying soft tissue swelling. Other carpals appear intact, as does the radiocarpal joint. Mild joint space narrowing, subchondral sclerosis and osteophyte formation in the radiocarpal joint, indicative of osteoarthritis. IMPRESSION: 1. Acute fracture of the triquetrum, as above. Electronically Signed   By: Nicole [name] M.D.   On: 06/28/2022 12:33    Procedures Procedures (including critical care time)  Medications Ordered in UC Medications - No data to display  Initial Impression / Assessment and Plan / UC Course  I have reviewed the triage vital signs and the nursing notes.  Pertinent labs & imaging results that were available during my care of the patient were reviewed by me and considered in my medical decision making (see chart for details).     MDM: 1. Closed nondisplaced fracture of triquetrum of right wrist, initial encounter-Rx'd Celebrex, Percocet, right hand/wrist ace wrap has been put in place prior to discharge. Advised/informed patient of right wrist x-ray results.  Advised/encouraged patient to wear right wrist Ace wrap and to immobilize right wrist completely until being evaluated by orthopedic/sports medicine provider.  Advised patient to follow-up with [place] orthopedic provider first thing Monday morning, 06/30/2022.  Contact information is provided below.  Final Clinical Impressions(s) / UC Diagnoses   Final diagnoses:  Closed displaced fracture of triquetrum of right wrist, initial encounter     Discharge Instructions      Advised/informed patient of right wrist x-ray results.  Advised/encouraged patient to wear right wrist Ace wrap and to immobilize right wrist completely until being evaluated by orthopedic/sports medicine provider.  Advised patient to follow-up with [place] orthopedic provider first thing Monday morning, 06/30/2022.  Contact information is provided  below.  Advised patient to take Celebrex as directed with food to completion.  Advised may take Percocet for breakthrough pain daily, as needed.  Encouraged patient to increase daily water intake while taking these medications.     ED Prescriptions     Medication Sig Dispense Auth. Provider   celecoxib (CELEBREX) 100 MG capsule Take 1 capsule (100 mg total) by mouth 2 (two) times daily for 15 days. 30 capsule Eliezer Lofts, FNP   oxyCODONE-acetaminophen (PERCOCET/ROXICET) 5-325 MG tablet Take 1-2 tablets by mouth daily as needed for severe pain. 30 tablet Eliezer Lofts, FNP      I have reviewed the PDMP during this encounter.   Eliezer Lofts, [place] 06/28/22 1325

## 2022-06-29 ENCOUNTER — Telehealth: Payer: Self-pay

## 2022-06-29 NOTE — Telephone Encounter (Signed)
TC to f/u after yesterday's visit to [facility]. No answer. Left VM to call [facility] if there are any questions or concerns.

## 2022-06-30 ENCOUNTER — Ambulatory Visit: Payer: No Typology Code available for payment source | Admitting: Family Medicine

## 2022-06-30 ENCOUNTER — Encounter: Payer: Self-pay | Admitting: Family Medicine

## 2022-06-30 VITALS — BP 100/68 | Ht 70.0 in | Wt 142.0 lb

## 2022-06-30 DIAGNOSIS — S62114D Nondisplaced fracture of triquetrum [cuneiform] bone, right wrist, subsequent encounter for fracture with routine healing: Secondary | ICD-10-CM | POA: Insufficient documentation

## 2022-06-30 DIAGNOSIS — S62114A Nondisplaced fracture of triquetrum [cuneiform] bone, right wrist, initial encounter for closed fracture: Secondary | ICD-10-CM

## 2022-06-30 NOTE — Patient Instructions (Signed)
Nice to meet you Please use ice as needed  Please continue the splint   Please send me a message in MyChart with any questions or updates.  Please see me back on Thursday.   --Dr. Jordan Likes

## 2022-06-30 NOTE — Progress Notes (Signed)
  Nicole [name] - 41 y.o. female MRN [id number]  Date of birth: 1980/12/17  SUBJECTIVE:  Including CC & ROS.  No chief complaint on file.   Nicole [name] is a 41 y.o. female that is presenting with right wrist pain.  She had a fall on Friday and was seen in urgent care.  She was found to have a fracture of her wrist.  She was placed on an Ace wrap at that time.  Still having some swelling.  Pain has improved..  Review of the urgent care note from 7/15 shows she was provided Celebrex and Percocet. Independent review of the right wrist x-ray from 7/15 shows an acute fracture of the triquetrum.  Review of Systems See HPI   HISTORY: Past Medical, Surgical, Social, and Family History Reviewed & Updated per EMR.   Pertinent Historical Findings include:  Past Medical History:  Diagnosis Date   Acute blood loss anemia 09/21/2012   Anemia    Anxiety    Depression    Post partum   H/O breast augmentation    Newborn product of IVF pregnancy    Oligomenorrhea    PCOS (polycystic ovarian syndrome)    Ruptured ovarian cyst     Past Surgical History:  Procedure Laterality Date   WISDOM TOOTH EXTRACTION       PHYSICAL EXAM:  VS: BP 100/68 (BP Location: Left Arm, Patient Position: Sitting)   Ht 5\' 10"  (1.778 m)   Wt 142 lb (64.4 kg)   LMP 06/12/2022   BMI 20.37 kg/m  Physical Exam Gen: NAD, alert, cooperative with exam, well-appearing MSK:  Neurovascularly intact    1. Hand/wrist  2. right 3. Volar splint 4. Ortho-glass 5. Applied by Dr. 06/14/2022      ASSESSMENT & PLAN:   Nondisplaced fracture of triquetrum (cuneiform) bone, right wrist, initial encounter for closed fracture Acutely occurring after recent fall.  Initial injury on 7/14.  Having swelling today. -Counseled on home exercise therapy and supportive care. -Volar splint today. -Counseled on Celebrex and Percocet. -Follow-up in a few days to transition to a cast.

## 2022-06-30 NOTE — Assessment & Plan Note (Signed)
Acutely occurring after recent fall.  Initial injury on 7/14.  Having swelling today. -Counseled on home exercise therapy and supportive care. -Volar splint today. -Counseled on Celebrex and Percocet. -Follow-up in a few days to transition to a cast.

## 2022-07-03 ENCOUNTER — Ambulatory Visit: Payer: No Typology Code available for payment source | Admitting: Family Medicine

## 2022-07-03 ENCOUNTER — Encounter: Payer: Self-pay | Admitting: Family Medicine

## 2022-07-03 VITALS — BP 90/62 | Ht 70.0 in | Wt 142.0 lb

## 2022-07-03 DIAGNOSIS — S62114D Nondisplaced fracture of triquetrum [cuneiform] bone, right wrist, subsequent encounter for fracture with routine healing: Secondary | ICD-10-CM | POA: Diagnosis not present

## 2022-07-03 NOTE — Progress Notes (Signed)
  Nicole [name] - 41 y.o. female MRN [id number]  Date of birth: [birth year]  SUBJECTIVE:  Including CC & ROS.  No chief complaint on file.   Nicole [name] is a 41 y.o. female that is following up for her right wrist fracture.  The bruising and pain have improved.   Review of Systems See HPI   HISTORY: Past Medical, Surgical, Social, and Family History Reviewed & Updated per EMR.   Pertinent Historical Findings include:  Past Medical History:  Diagnosis Date   Acute blood loss anemia 09/21/2012   Anemia    Anxiety    Depression    Post partum   H/O breast augmentation    Newborn product of IVF pregnancy    Oligomenorrhea    PCOS (polycystic ovarian syndrome)    Ruptured ovarian cyst     Past Surgical History:  Procedure Laterality Date   WISDOM TOOTH EXTRACTION       PHYSICAL EXAM:  VS: BP 90/62 (BP Location: Right Arm, Patient Position: Sitting)   Ht 5\' 10"  (1.778 m)   Wt 142 lb (64.4 kg)   LMP 06/12/2022   BMI 20.37 kg/m  Physical Exam Gen: NAD, alert, cooperative with exam, well-appearing MSK:  Neurovascularly intact       ASSESSMENT & PLAN:   Nondisplaced fracture of triquetrum (cuneiform) bone, right wrist, subsequent encounter for fracture with routine healing Acutely occurring with initial injury on 7/14.  Having improvement in swelling today. -Counseled on home exercise therapy and supportive care. - placed in exos cast - f/u in 2 weeks

## 2022-07-03 NOTE — Patient Instructions (Signed)
Good to see you Please use the cast   Please send me a message in MyChart with any questions or updates.  Please see me back in 2 weeks.   --Dr. Jordan Likes

## 2022-07-03 NOTE — Assessment & Plan Note (Signed)
Acutely occurring with initial injury on 7/14.  Having improvement in swelling today. -Counseled on home exercise therapy and supportive care. - placed in exos cast - f/u in 2 weeks

## 2022-07-17 ENCOUNTER — Encounter: Payer: Self-pay | Admitting: Family Medicine

## 2022-07-17 ENCOUNTER — Ambulatory Visit (INDEPENDENT_AMBULATORY_CARE_PROVIDER_SITE_OTHER): Payer: No Typology Code available for payment source | Admitting: Family Medicine

## 2022-07-17 VITALS — BP 100/65 | Ht 70.0 in | Wt 142.0 lb

## 2022-07-17 DIAGNOSIS — S62114D Nondisplaced fracture of triquetrum [cuneiform] bone, right wrist, subsequent encounter for fracture with routine healing: Secondary | ICD-10-CM

## 2022-07-17 NOTE — Progress Notes (Signed)
  Nicole [name] - 41 y.o. female MRN [id number]  Date of birth: 1981/02/11  SUBJECTIVE:  Including CC & ROS.  No chief complaint on file.   Nicole [name] is a 41 y.o. female that is following up for her right wrist fracture.  She only has minor pain without the cast on.   Review of Systems See HPI   HISTORY: Past Medical, Surgical, Social, and Family History Reviewed & Updated per EMR.   Pertinent Historical Findings include:  Past Medical History:  Diagnosis Date   Acute blood loss anemia 09/21/2012   Anemia    Anxiety    Depression    Post partum   H/O breast augmentation    Newborn product of IVF pregnancy    Oligomenorrhea    PCOS (polycystic ovarian syndrome)    Ruptured ovarian cyst     Past Surgical History:  Procedure Laterality Date   WISDOM TOOTH EXTRACTION       PHYSICAL EXAM:  VS: BP 100/65 (BP Location: Left Arm, Patient Position: Sitting)   Ht 5\' 10"  (1.778 m)   Wt 142 lb (64.4 kg)   LMP 06/12/2022   BMI 20.37 kg/m  Physical Exam Gen: NAD, alert, cooperative with exam, well-appearing MSK:  Neurovascularly intact       ASSESSMENT & PLAN:   Nondisplaced fracture of triquetrum (cuneiform) bone, right wrist, subsequent encounter for fracture with routine healing Initial injury on 7/14.  Only having minor pain to palpation today. -Counseled on home exercise therapy and supportive care. -Continue the cast for 1 more week and counseled on weaning out of it.

## 2022-07-17 NOTE — Assessment & Plan Note (Signed)
Initial injury on 7/14.  Only having minor pain to palpation today. -Counseled on home exercise therapy and supportive care. -Continue the cast for 1 more week and counseled on weaning out of it.

## 2022-07-17 NOTE — Patient Instructions (Signed)
Good to see you Please use ice as needed  Please use the cast for one more week.  Please stop the cast when there is no pain  Please send me a message in MyChart with any questions or updates.  Please see me back as needed.   --Dr. Jordan Likes

## 2022-07-22 ENCOUNTER — Encounter: Payer: Self-pay | Admitting: Family Medicine

## 2022-07-22 ENCOUNTER — Other Ambulatory Visit (HOSPITAL_COMMUNITY): Payer: Self-pay

## 2022-07-22 MED ORDER — SERTRALINE HCL 50 MG PO TABS
50.0000 mg | ORAL_TABLET | Freq: Every day | ORAL | 3 refills | Status: DC
  Filled 2022-07-22 – 2022-08-01 (×2): qty 90, 90d supply, fill #0
  Filled 2022-10-23: qty 90, 90d supply, fill #1
  Filled 2022-10-24: qty 90, 90d supply, fill #0
  Filled 2023-01-25: qty 90, 90d supply, fill #1
  Filled 2023-04-20: qty 90, 90d supply, fill #2

## 2022-07-23 ENCOUNTER — Encounter: Payer: Self-pay | Admitting: Family Medicine

## 2022-07-30 ENCOUNTER — Other Ambulatory Visit (HOSPITAL_COMMUNITY): Payer: Self-pay

## 2022-08-01 ENCOUNTER — Other Ambulatory Visit (HOSPITAL_COMMUNITY): Payer: Self-pay

## 2022-08-12 ENCOUNTER — Other Ambulatory Visit (HOSPITAL_COMMUNITY): Payer: Self-pay

## 2022-08-13 ENCOUNTER — Other Ambulatory Visit (HOSPITAL_COMMUNITY): Payer: Self-pay

## 2022-08-13 MED ORDER — LEVOTHYROXINE SODIUM 50 MCG PO TABS
50.0000 ug | ORAL_TABLET | Freq: Every morning | ORAL | 1 refills | Status: DC
  Filled 2022-08-13: qty 90, 90d supply, fill #0
  Filled 2022-11-14: qty 90, 90d supply, fill #1

## 2022-10-23 ENCOUNTER — Other Ambulatory Visit (HOSPITAL_COMMUNITY): Payer: Self-pay

## 2022-10-24 ENCOUNTER — Other Ambulatory Visit (HOSPITAL_COMMUNITY): Payer: Self-pay

## 2022-10-24 ENCOUNTER — Other Ambulatory Visit (HOSPITAL_BASED_OUTPATIENT_CLINIC_OR_DEPARTMENT_OTHER): Payer: Self-pay

## 2022-11-14 ENCOUNTER — Other Ambulatory Visit (HOSPITAL_BASED_OUTPATIENT_CLINIC_OR_DEPARTMENT_OTHER): Payer: Self-pay

## 2022-11-23 ENCOUNTER — Telehealth: Payer: No Typology Code available for payment source | Admitting: Physician Assistant

## 2022-11-23 ENCOUNTER — Other Ambulatory Visit (HOSPITAL_BASED_OUTPATIENT_CLINIC_OR_DEPARTMENT_OTHER): Payer: Self-pay

## 2022-11-23 DIAGNOSIS — R3989 Other symptoms and signs involving the genitourinary system: Secondary | ICD-10-CM | POA: Diagnosis not present

## 2022-11-23 MED ORDER — CEPHALEXIN 500 MG PO CAPS: 500.0000 mg | ORAL_CAPSULE | Freq: Two times a day (BID) | ORAL | 0 refills | Status: DC

## 2022-11-23 MED ORDER — CEPHALEXIN 500 MG PO CAPS
500.0000 mg | ORAL_CAPSULE | Freq: Two times a day (BID) | ORAL | 0 refills | Status: DC
  Filled 2022-11-23: qty 14, 7d supply, fill #0

## 2022-11-23 NOTE — Progress Notes (Signed)

## 2022-11-23 NOTE — Addendum Note (Signed)
Addended by: Margaretann Loveless on: 11/23/2022 09:42 AM   Modules accepted: Orders

## 2022-12-25 DIAGNOSIS — F4320 Adjustment disorder, unspecified: Secondary | ICD-10-CM | POA: Diagnosis not present

## 2022-12-28 ENCOUNTER — Telehealth: Payer: 59 | Admitting: Physician Assistant

## 2022-12-28 DIAGNOSIS — J019 Acute sinusitis, unspecified: Secondary | ICD-10-CM | POA: Diagnosis not present

## 2022-12-28 DIAGNOSIS — B9689 Other specified bacterial agents as the cause of diseases classified elsewhere: Secondary | ICD-10-CM | POA: Diagnosis not present

## 2022-12-28 MED ORDER — BENZONATATE 100 MG PO CAPS: 100.0000 mg | ORAL_CAPSULE | Freq: Three times a day (TID) | ORAL | 0 refills | Status: AC | PRN

## 2022-12-28 MED ORDER — AMOXICILLIN-POT CLAVULANATE 875-125 MG PO TABS: 1.0000 | ORAL_TABLET | Freq: Two times a day (BID) | ORAL | 0 refills | Status: DC

## 2022-12-28 NOTE — Progress Notes (Signed)
E-Visit for Sinus Problems  We are sorry that you are not feeling well.  Here is how we plan to help!  Based on what you have shared with me it looks like you have sinusitis.  Sinusitis is inflammation and infection in the sinus cavities of the head.  Based on your presentation I believe you most likely have Acute Bacterial Sinusitis.  This is an infection caused by bacteria and is treated with antibiotics. I have prescribed Augmentin 875mg /125mg  one tablet twice daily with food, for 10 days. Tessalon perles were also prescribed. Take 1 capsule every 8 hours as needed for cough. This can be used with other OTC cough medications as needed. You may use an oral decongestant such as Mucinex D or if you have glaucoma or high blood pressure use plain Mucinex. Saline nasal spray help and can safely be used as often as needed for congestion.  If you develop worsening sinus pain, fever or notice severe headache and vision changes, or if symptoms are not better after completion of antibiotic, please schedule an appointment with a health care provider.    Sinus infections are not as easily transmitted as other respiratory infection, however we still recommend that you avoid close contact with loved ones, especially the very young and elderly.  Remember to wash your hands thoroughly throughout the day as this is the number one way to prevent the spread of infection!  Home Care: Only take medications as instructed by your medical team. Complete the entire course of an antibiotic. Do not take these medications with alcohol. A steam or ultrasonic humidifier can help congestion.  You can place a towel over your head and breathe in the steam from hot water coming from a faucet. Avoid close contacts especially the very young and the elderly. Cover your mouth when you cough or sneeze. Always remember to wash your hands.  Get Help Right Away If: You develop worsening fever or sinus pain. You develop a severe head  ache or visual changes. Your symptoms persist after you have completed your treatment plan.  Make sure you Understand these instructions. Will watch your condition. Will get help right away if you are not doing well or get worse.  Thank you for choosing an e-visit.  Your e-visit answers were reviewed by a board certified advanced clinical practitioner to complete your personal care plan. Depending upon the condition, your plan could have included both over the counter or prescription medications.  Please review your pharmacy choice. Make sure the pharmacy is open so you can pick up prescription now. If there is a problem, you may contact your provider through [employer] and have the prescription routed to another pharmacy.  Your safety is important to [place]. If you have drug allergies check your prescription carefully.   For the next 24 hours you can use MyChart to ask questions about today's visit, request a non-urgent call back, or ask for a work or school excuse. You will get an email in the next two days asking about your experience. I hope that your e-visit has been valuable and will speed your recovery.  I have spent 5 minutes in review of e-visit questionnaire, review and updating patient chart, medical decision making and response to patient.   Mar Daring, PA-C

## 2023-01-29 DIAGNOSIS — Z0000 Encounter for general adult medical examination without abnormal findings: Secondary | ICD-10-CM | POA: Diagnosis not present

## 2023-02-06 ENCOUNTER — Other Ambulatory Visit (HOSPITAL_BASED_OUTPATIENT_CLINIC_OR_DEPARTMENT_OTHER): Payer: Self-pay

## 2023-02-06 MED ORDER — LEVOTHYROXINE SODIUM 50 MCG PO TABS
50.0000 ug | ORAL_TABLET | Freq: Every morning | ORAL | 1 refills | Status: DC
  Filled 2023-02-06: qty 90, 90d supply, fill #0
  Filled 2023-05-13: qty 90, 90d supply, fill #1

## 2023-03-30 ENCOUNTER — Encounter: Payer: Self-pay | Admitting: *Deleted

## 2023-04-20 ENCOUNTER — Other Ambulatory Visit (HOSPITAL_BASED_OUTPATIENT_CLINIC_OR_DEPARTMENT_OTHER): Payer: Self-pay

## 2023-05-13 ENCOUNTER — Other Ambulatory Visit (HOSPITAL_BASED_OUTPATIENT_CLINIC_OR_DEPARTMENT_OTHER): Payer: Self-pay

## 2023-05-15 ENCOUNTER — Other Ambulatory Visit (HOSPITAL_BASED_OUTPATIENT_CLINIC_OR_DEPARTMENT_OTHER): Payer: Self-pay

## 2023-05-15 DIAGNOSIS — M545 Low back pain, unspecified: Secondary | ICD-10-CM | POA: Diagnosis not present

## 2023-05-15 MED ORDER — METHOCARBAMOL 500 MG PO TABS
500.0000 mg | ORAL_TABLET | Freq: Every day | ORAL | 0 refills | Status: DC
  Filled 2023-05-15: qty 30, 10d supply, fill #0

## 2023-05-15 MED ORDER — PREDNISONE 10 MG PO TABS
ORAL_TABLET | ORAL | 0 refills | Status: DC
  Filled 2023-05-15: qty 42, 12d supply, fill #0

## 2023-05-17 DIAGNOSIS — M545 Low back pain, unspecified: Secondary | ICD-10-CM | POA: Diagnosis not present

## 2023-05-18 ENCOUNTER — Other Ambulatory Visit (HOSPITAL_BASED_OUTPATIENT_CLINIC_OR_DEPARTMENT_OTHER): Payer: Self-pay

## 2023-05-18 MED ORDER — PREDNISONE 10 MG PO TABS
ORAL_TABLET | ORAL | 0 refills | Status: DC
  Filled 2023-05-18: qty 21, 6d supply, fill #0

## 2023-05-18 MED ORDER — METHOCARBAMOL 500 MG PO TABS
500.0000 mg | ORAL_TABLET | Freq: Every day | ORAL | 0 refills | Status: DC
  Filled 2023-05-18: qty 30, 30d supply, fill #0

## 2023-05-19 DIAGNOSIS — M545 Low back pain, unspecified: Secondary | ICD-10-CM | POA: Diagnosis not present

## 2023-06-12 DIAGNOSIS — D225 Melanocytic nevi of trunk: Secondary | ICD-10-CM | POA: Diagnosis not present

## 2023-06-12 DIAGNOSIS — L821 Other seborrheic keratosis: Secondary | ICD-10-CM | POA: Diagnosis not present

## 2023-06-12 DIAGNOSIS — Z808 Family history of malignant neoplasm of other organs or systems: Secondary | ICD-10-CM | POA: Diagnosis not present

## 2023-06-12 DIAGNOSIS — D224 Melanocytic nevi of scalp and neck: Secondary | ICD-10-CM | POA: Diagnosis not present

## 2023-06-12 DIAGNOSIS — L578 Other skin changes due to chronic exposure to nonionizing radiation: Secondary | ICD-10-CM | POA: Diagnosis not present

## 2023-07-23 ENCOUNTER — Other Ambulatory Visit (HOSPITAL_BASED_OUTPATIENT_CLINIC_OR_DEPARTMENT_OTHER): Payer: Self-pay

## 2023-07-23 MED ORDER — SERTRALINE HCL 50 MG PO TABS
50.0000 mg | ORAL_TABLET | Freq: Every day | ORAL | 3 refills | Status: DC
  Filled 2023-07-23: qty 90, 90d supply, fill #0
  Filled 2023-10-26: qty 90, 90d supply, fill #1
  Filled 2024-01-26: qty 90, 90d supply, fill #2
  Filled 2024-04-28: qty 90, 90d supply, fill #3

## 2023-08-13 ENCOUNTER — Other Ambulatory Visit (HOSPITAL_BASED_OUTPATIENT_CLINIC_OR_DEPARTMENT_OTHER): Payer: Self-pay

## 2023-08-13 MED ORDER — LEVOTHYROXINE SODIUM 50 MCG PO TABS
50.0000 ug | ORAL_TABLET | ORAL | 1 refills | Status: DC
  Filled 2023-08-13: qty 90, 90d supply, fill #0
  Filled 2023-11-14 (×2): qty 90, 90d supply, fill #1

## 2023-09-18 DIAGNOSIS — Z1231 Encounter for screening mammogram for malignant neoplasm of breast: Secondary | ICD-10-CM | POA: Diagnosis not present

## 2023-10-06 DIAGNOSIS — F32A Depression, unspecified: Secondary | ICD-10-CM | POA: Diagnosis not present

## 2023-10-06 DIAGNOSIS — Z01419 Encounter for gynecological examination (general) (routine) without abnormal findings: Secondary | ICD-10-CM | POA: Diagnosis not present

## 2023-10-06 DIAGNOSIS — E039 Hypothyroidism, unspecified: Secondary | ICD-10-CM | POA: Diagnosis not present

## 2023-11-14 ENCOUNTER — Other Ambulatory Visit (HOSPITAL_BASED_OUTPATIENT_CLINIC_OR_DEPARTMENT_OTHER): Payer: Self-pay

## 2023-12-11 ENCOUNTER — Other Ambulatory Visit (HOSPITAL_BASED_OUTPATIENT_CLINIC_OR_DEPARTMENT_OTHER): Payer: Self-pay

## 2023-12-11 ENCOUNTER — Telehealth: Payer: 59 | Admitting: Emergency Medicine

## 2023-12-11 DIAGNOSIS — B9689 Other specified bacterial agents as the cause of diseases classified elsewhere: Secondary | ICD-10-CM | POA: Diagnosis not present

## 2023-12-11 DIAGNOSIS — J019 Acute sinusitis, unspecified: Secondary | ICD-10-CM | POA: Diagnosis not present

## 2023-12-11 MED ORDER — AMOXICILLIN-POT CLAVULANATE 875-125 MG PO TABS
1.0000 | ORAL_TABLET | Freq: Two times a day (BID) | ORAL | 0 refills | Status: DC
  Filled 2023-12-11: qty 14, 7d supply, fill #0

## 2023-12-11 NOTE — Progress Notes (Signed)
E-Visit for Sinus Problems  We are sorry that you are not feeling well.  Here is how we plan to help!  Based on what you have shared with me it looks like you have sinusitis.  Sinusitis is inflammation and infection in the sinus cavities of the head.  Based on your presentation I believe you most likely have Acute Bacterial Sinusitis.  This is an infection caused by bacteria and is treated with antibiotics. I have prescribed Augmentin 875mg/125mg one tablet twice daily with food, for 7 days. You may use an oral decongestant such as Mucinex D or if you have glaucoma or high blood pressure use plain Mucinex. Saline nasal spray help and can safely be used as often as needed for congestion.  If you develop worsening sinus pain, fever or notice severe headache and vision changes, or if symptoms are not better after completion of antibiotic, please schedule an appointment with a health care provider.    Sinus infections are not as easily transmitted as other respiratory infection, however we still recommend that you avoid close contact with loved ones, especially the very young and elderly.  Remember to wash your hands thoroughly throughout the day as this is the number one way to prevent the spread of infection!  Home Care: Only take medications as instructed by your medical team. Complete the entire course of an antibiotic. Do not take these medications with alcohol. A steam or ultrasonic humidifier can help congestion.  You can place a towel over your head and breathe in the steam from hot water coming from a faucet. Avoid close contacts especially the very young and the elderly. Cover your mouth when you cough or sneeze. Always remember to wash your hands.  Get Help Right Away If: You develop worsening fever or sinus pain. You develop a severe head ache or visual changes. Your symptoms persist after you have completed your treatment plan.  Make sure you Understand these instructions. Will watch  your condition. Will get help right away if you are not doing well or get worse.  Thank you for choosing an e-visit.  Your e-visit answers were reviewed by a board certified advanced clinical practitioner to complete your personal care plan. Depending upon the condition, your plan could have included both over the counter or prescription medications.  Please review your pharmacy choice. Make sure the pharmacy is open so you can pick up prescription now. If there is a problem, you may contact your provider through MyChart messaging and have the prescription routed to another pharmacy.  Your safety is important to us. If you have drug allergies check your prescription carefully.   For the next 24 hours you can use MyChart to ask questions about today's visit, request a non-urgent call back, or ask for a work or school excuse. You will get an email in the next two days asking about your experience. I hope that your e-visit has been valuable and will speed your recovery.  Approximately 5 minutes was used in reviewing the patient's chart, questionnaire, prescribing medications, and documentation.  

## 2024-01-25 DIAGNOSIS — Z0000 Encounter for general adult medical examination without abnormal findings: Secondary | ICD-10-CM | POA: Diagnosis not present

## 2024-01-25 DIAGNOSIS — E039 Hypothyroidism, unspecified: Secondary | ICD-10-CM | POA: Diagnosis not present

## 2024-01-25 DIAGNOSIS — R7989 Other specified abnormal findings of blood chemistry: Secondary | ICD-10-CM | POA: Diagnosis not present

## 2024-01-27 ENCOUNTER — Other Ambulatory Visit (HOSPITAL_BASED_OUTPATIENT_CLINIC_OR_DEPARTMENT_OTHER): Payer: Self-pay

## 2024-02-01 ENCOUNTER — Other Ambulatory Visit (HOSPITAL_BASED_OUTPATIENT_CLINIC_OR_DEPARTMENT_OTHER): Payer: Self-pay

## 2024-02-01 DIAGNOSIS — J320 Chronic maxillary sinusitis: Secondary | ICD-10-CM | POA: Diagnosis not present

## 2024-02-01 DIAGNOSIS — Z0000 Encounter for general adult medical examination without abnormal findings: Secondary | ICD-10-CM | POA: Diagnosis not present

## 2024-02-01 MED ORDER — AMOXICILLIN-POT CLAVULANATE 500-125 MG PO TABS
1.0000 | ORAL_TABLET | Freq: Two times a day (BID) | ORAL | 0 refills | Status: AC
  Filled 2024-02-01: qty 20, 10d supply, fill #0

## 2024-02-09 ENCOUNTER — Other Ambulatory Visit: Payer: Self-pay

## 2024-02-09 ENCOUNTER — Other Ambulatory Visit (HOSPITAL_BASED_OUTPATIENT_CLINIC_OR_DEPARTMENT_OTHER): Payer: Self-pay

## 2024-02-09 MED ORDER — LEVOTHYROXINE SODIUM 50 MCG PO TABS
50.0000 ug | ORAL_TABLET | Freq: Every day | ORAL | 1 refills | Status: AC
  Filled 2024-02-09: qty 90, 90d supply, fill #0
  Filled 2024-05-10: qty 90, 90d supply, fill #1

## 2024-04-07 DIAGNOSIS — D225 Melanocytic nevi of trunk: Secondary | ICD-10-CM | POA: Diagnosis not present

## 2024-04-07 DIAGNOSIS — L578 Other skin changes due to chronic exposure to nonionizing radiation: Secondary | ICD-10-CM | POA: Diagnosis not present

## 2024-04-07 DIAGNOSIS — D224 Melanocytic nevi of scalp and neck: Secondary | ICD-10-CM | POA: Diagnosis not present

## 2024-04-07 DIAGNOSIS — L821 Other seborrheic keratosis: Secondary | ICD-10-CM | POA: Diagnosis not present

## 2024-04-07 DIAGNOSIS — Z808 Family history of malignant neoplasm of other organs or systems: Secondary | ICD-10-CM | POA: Diagnosis not present

## 2024-04-28 ENCOUNTER — Other Ambulatory Visit: Payer: Self-pay

## 2024-04-28 ENCOUNTER — Other Ambulatory Visit (HOSPITAL_BASED_OUTPATIENT_CLINIC_OR_DEPARTMENT_OTHER): Payer: Self-pay

## 2024-04-28 MED ORDER — LEVOTHYROXINE SODIUM 50 MCG PO TABS
50.0000 ug | ORAL_TABLET | Freq: Every morning | ORAL | 1 refills | Status: AC
  Filled 2024-08-13: qty 90, 90d supply, fill #0
  Filled 2024-11-16: qty 90, 90d supply, fill #1

## 2024-05-10 ENCOUNTER — Other Ambulatory Visit: Payer: Self-pay

## 2024-05-10 ENCOUNTER — Other Ambulatory Visit (HOSPITAL_BASED_OUTPATIENT_CLINIC_OR_DEPARTMENT_OTHER): Payer: Self-pay

## 2024-05-10 ENCOUNTER — Other Ambulatory Visit (HOSPITAL_COMMUNITY): Payer: Self-pay

## 2024-07-23 ENCOUNTER — Telehealth: Admitting: Nurse Practitioner

## 2024-07-23 ENCOUNTER — Other Ambulatory Visit (HOSPITAL_BASED_OUTPATIENT_CLINIC_OR_DEPARTMENT_OTHER): Payer: Self-pay

## 2024-07-23 DIAGNOSIS — J40 Bronchitis, not specified as acute or chronic: Secondary | ICD-10-CM | POA: Diagnosis not present

## 2024-07-23 MED ORDER — AZITHROMYCIN 250 MG PO TABS
ORAL_TABLET | ORAL | 0 refills | Status: AC
Start: 2024-07-23 — End: 2024-07-28
  Filled 2024-07-23: qty 6, 5d supply, fill #0

## 2024-07-23 MED ORDER — PSEUDOEPH-BROMPHEN-DM 30-2-10 MG/5ML PO SYRP
5.0000 mL | ORAL_SOLUTION | Freq: Four times a day (QID) | ORAL | 0 refills | Status: AC | PRN
Start: 2024-07-23 — End: ?
  Filled 2024-07-23: qty 240, 12d supply, fill #0

## 2024-07-23 NOTE — Progress Notes (Signed)
 I have spent 5 minutes in review of e-visit questionnaire, review and updating patient chart, medical decision making and response to patient.   Claiborne Rigg, NP

## 2024-07-23 NOTE — Progress Notes (Signed)
 E-Visit for Cough   We are sorry that you are not feeling well.  Here is how we plan to help!  Based on your presentation I believe you most likely have A cough due to bacteria.  When patients have a fever and a productive cough with a change in color or increased sputum production, we are concerned about bacterial bronchitis.  If left untreated it can progress to pneumonia.  If your symptoms do not improve with your treatment plan it is important that you contact your provider.   I have prescribed Azithromyin 250 mg: two tablets now and then one tablet daily for 4 additonal days    In addition you may use a prescription cough syrup which has been sent   From your responses in the eVisit questionnaire you describe inflammation in the upper respiratory tract which is causing a significant cough.  This is commonly called Bronchitis and has four common causes:   Allergies Viral Infections Acid Reflux Bacterial Infection Allergies, viruses and acid reflux are treated by controlling symptoms or eliminating the cause. An example might be a cough caused by taking certain blood pressure medications. You stop the cough by changing the medication. Another example might be a cough caused by acid reflux. Controlling the reflux helps control the cough.  USE OF BRONCHODILATOR (RESCUE) INHALERS: There is a risk from using your bronchodilator too frequently.  The risk is that over-reliance on a medication which only relaxes the muscles surrounding the breathing tubes can reduce the effectiveness of medications prescribed to reduce swelling and congestion of the tubes themselves.  Although you feel brief relief from the bronchodilator inhaler, your asthma may actually be worsening with the tubes becoming more swollen and filled with mucus.  This can delay other crucial treatments, such as oral steroid medications. If you need to use a bronchodilator inhaler daily, several times per day, you should discuss this  with your provider.  There are probably better treatments that could be used to keep your asthma under control.     HOME CARE Only take medications as instructed by your medical team. Complete the entire course of an antibiotic. Drink plenty of fluids and get plenty of rest. Avoid close contacts especially the very young and the elderly Cover your mouth if you cough or cough into your sleeve. Always remember to wash your hands A steam or ultrasonic humidifier can help congestion.   GET HELP RIGHT AWAY IF: You develop worsening fever. You become short of breath You cough up blood. Your symptoms persist after you have completed your treatment plan MAKE SURE YOU  Understand these instructions. Will watch your condition. Will get help right away if you are not doing well or get worse.    Thank you for choosing an e-visit.  Your e-visit answers were reviewed by a board certified advanced clinical practitioner to complete your personal care plan. Depending upon the condition, your plan could have included both over the counter or prescription medications.  Please review your pharmacy choice. Make sure the pharmacy is open so you can pick up prescription now. If there is a problem, you may contact your provider through [employer] and have the prescription routed to another pharmacy.  Your safety is important to us . If you have drug allergies check your prescription carefully.   For the next 24 hours you can use MyChart to ask questions about today's visit, request a non-urgent call back, or ask for a work or school excuse. You will  get an email in the next two days asking about your experience. I hope that your e-visit has been valuable and will speed your recovery.

## 2024-07-25 ENCOUNTER — Other Ambulatory Visit (HOSPITAL_BASED_OUTPATIENT_CLINIC_OR_DEPARTMENT_OTHER): Payer: Self-pay

## 2024-07-25 DIAGNOSIS — J45901 Unspecified asthma with (acute) exacerbation: Secondary | ICD-10-CM | POA: Diagnosis not present

## 2024-07-25 MED ORDER — PREDNISONE 10 MG PO TABS
ORAL_TABLET | ORAL | 0 refills | Status: AC
  Filled 2024-07-25: qty 10, 4d supply, fill #0

## 2024-07-25 MED ORDER — ALBUTEROL SULFATE HFA 108 (90 BASE) MCG/ACT IN AERS
INHALATION_SPRAY | RESPIRATORY_TRACT | 0 refills | Status: AC
  Filled 2024-07-25: qty 6.7, 25d supply, fill #0

## 2024-08-13 ENCOUNTER — Other Ambulatory Visit (HOSPITAL_BASED_OUTPATIENT_CLINIC_OR_DEPARTMENT_OTHER): Payer: Self-pay

## 2024-09-20 ENCOUNTER — Other Ambulatory Visit (HOSPITAL_BASED_OUTPATIENT_CLINIC_OR_DEPARTMENT_OTHER): Payer: Self-pay

## 2024-09-20 MED ORDER — SERTRALINE HCL 50 MG PO TABS
50.0000 mg | ORAL_TABLET | Freq: Every day | ORAL | 3 refills | Status: AC
  Filled 2024-09-20: qty 90, 90d supply, fill #0
  Filled 2024-12-19: qty 90, 90d supply, fill #1

## 2024-09-23 DIAGNOSIS — Z1231 Encounter for screening mammogram for malignant neoplasm of breast: Secondary | ICD-10-CM | POA: Diagnosis not present

## 2024-12-27 ENCOUNTER — Other Ambulatory Visit (HOSPITAL_BASED_OUTPATIENT_CLINIC_OR_DEPARTMENT_OTHER): Payer: Self-pay
